# Patient Record
Sex: Male | Born: 1960 | Race: White | Hispanic: No | Smoking: Current every day smoker
Health system: Southern US, Community
[De-identification: ages and names within clinical notes are randomized; demographics above are authoritative.]

## PROBLEM LIST (undated history)

## (undated) DIAGNOSIS — F17200 Nicotine dependence, unspecified, uncomplicated: Secondary | ICD-10-CM

## (undated) DIAGNOSIS — F1010 Alcohol abuse, uncomplicated: Secondary | ICD-10-CM

---

## 2000-06-21 ENCOUNTER — Emergency Department (HOSPITAL_COMMUNITY): Admission: EM | Admit: 2000-06-21 | Discharge: 2000-06-21 | Payer: Self-pay | Admitting: Emergency Medicine

## 2000-06-28 ENCOUNTER — Emergency Department (HOSPITAL_COMMUNITY): Admission: EM | Admit: 2000-06-28 | Discharge: 2000-06-28 | Payer: Self-pay | Admitting: Emergency Medicine

## 2005-10-26 ENCOUNTER — Emergency Department (HOSPITAL_COMMUNITY): Admission: EM | Admit: 2005-10-26 | Discharge: 2005-10-26 | Payer: Self-pay | Admitting: Emergency Medicine

## 2007-11-05 ENCOUNTER — Emergency Department (HOSPITAL_COMMUNITY): Admission: EM | Admit: 2007-11-05 | Discharge: 2007-11-05 | Payer: Self-pay | Admitting: Emergency Medicine

## 2007-11-06 ENCOUNTER — Emergency Department (HOSPITAL_COMMUNITY): Admission: EM | Admit: 2007-11-06 | Discharge: 2007-11-06 | Payer: Self-pay | Admitting: Emergency Medicine

## 2009-09-08 ENCOUNTER — Emergency Department (HOSPITAL_COMMUNITY): Admission: EM | Admit: 2009-09-08 | Discharge: 2009-09-08 | Payer: Self-pay | Admitting: Emergency Medicine

## 2010-01-26 ENCOUNTER — Emergency Department (HOSPITAL_COMMUNITY): Admission: EM | Admit: 2010-01-26 | Discharge: 2010-01-26 | Payer: Self-pay | Admitting: Emergency Medicine

## 2010-06-14 LAB — COMPREHENSIVE METABOLIC PANEL
ALT: 20 U/L (ref 0–53)
Alkaline Phosphatase: 65 U/L (ref 39–117)
BUN: 7 mg/dL (ref 6–23)
CO2: 28 mEq/L (ref 19–32)
Calcium: 9.1 mg/dL (ref 8.4–10.5)
Chloride: 101 mEq/L (ref 96–112)
Creatinine, Ser: 0.96 mg/dL (ref 0.4–1.5)
GFR calc Af Amer: >60 mL/min (ref >60)
Glucose, Bld: 110 mg/dL — ABNORMAL HIGH (ref 70–99)
Potassium: 4.2 mEq/L (ref 3.5–5.1)
Sodium: 134 mEq/L — ABNORMAL LOW (ref 135–145)
Total Bilirubin: 0.5 mg/dL (ref 0.3–1.2)
Total Protein: 6.9 g/dL (ref 6.0–8.3)

## 2010-06-14 LAB — DIFFERENTIAL
Basophils Absolute: 0 10*3/uL (ref 0.0–0.1)
Basophils Relative: 0 % (ref 0–1)

## 2010-06-14 LAB — CBC
HCT: 45.6 % (ref 39.0–52.0)
Platelets: 166 10*3/uL (ref 150–400)

## 2010-06-14 LAB — URINALYSIS, ROUTINE W REFLEX MICROSCOPIC
Hgb urine dipstick: NEGATIVE
Specific Gravity, Urine: 1.013 (ref 1.005–1.030)
pH: 7 (ref 5.0–8.0)

## 2010-12-24 LAB — WOUND CULTURE

## 2011-01-02 ENCOUNTER — Emergency Department (HOSPITAL_COMMUNITY)
Admission: EM | Admit: 2011-01-02 | Discharge: 2011-01-02 | Disposition: A | Discharge status: Home / Self Care | Payer: Self-pay | Attending: Emergency Medicine | Admitting: Emergency Medicine

## 2011-01-02 DIAGNOSIS — T622X1A Toxic effect of other ingested (parts of) plant(s), accidental (unintentional), initial encounter: Secondary | ICD-10-CM | POA: Insufficient documentation

## 2011-01-02 DIAGNOSIS — L255 Unspecified contact dermatitis due to plants, except food: Secondary | ICD-10-CM | POA: Insufficient documentation

## 2011-01-17 ENCOUNTER — Emergency Department (HOSPITAL_COMMUNITY)
Admission: EM | Admit: 2011-01-17 | Discharge: 2011-01-17 | Disposition: A | Discharge status: Home / Self Care | Payer: Self-pay | Attending: Emergency Medicine | Admitting: Emergency Medicine

## 2011-01-17 DIAGNOSIS — F17200 Nicotine dependence, unspecified, uncomplicated: Secondary | ICD-10-CM | POA: Insufficient documentation

## 2011-01-17 DIAGNOSIS — F411 Generalized anxiety disorder: Secondary | ICD-10-CM | POA: Insufficient documentation

## 2011-01-17 DIAGNOSIS — R21 Rash and other nonspecific skin eruption: Secondary | ICD-10-CM | POA: Insufficient documentation

## 2011-07-26 ENCOUNTER — Ambulatory Visit: Payer: Self-pay | Admitting: Internal Medicine

## 2011-07-26 VITALS — BP 116/79 | HR 77 | Temp 98.2°F | Resp 16 | Ht 64.0 in

## 2011-07-26 DIAGNOSIS — L0390 Cellulitis, unspecified: Secondary | ICD-10-CM

## 2011-07-26 DIAGNOSIS — L0291 Cutaneous abscess, unspecified: Secondary | ICD-10-CM

## 2011-07-26 MED ORDER — SULFAMETHOXAZOLE-TRIMETHOPRIM 800-160 MG PO TABS: 1.0000 | ORAL_TABLET | Freq: Two times a day (BID) | ORAL | Status: AC

## 2011-07-26 NOTE — Progress Notes (Signed)
  Subjective:    Patient ID: Daniel Abbott, male    DOB: 09-Apr-1960, 51 y.o.   MRN: [id number]  HPI Multiple insect bites on left had and right arm and left arm Swelling pain puss coming from woudn no fever Onset 2 days ago Pt stuck a nail into each bite to drain it himself Tetanus unknown No meds  trying to get antibiotics from his friend on the "black market"  Review of Systems  Constitutional: Negative.   HENT: Negative.   Eyes: Negative.   Respiratory: Negative.   Cardiovascular: Negative.   Genitourinary: Negative.   Musculoskeletal: Negative.   Skin: Positive for wound.       One abcess right hand 3 on left hand and arm  Neurological: Negative.   Hematological: Negative.   Psychiatric/Behavioral: Negative.   All other systems reviewed and are negative.       Objective:   Physical Exam  Nursing note and vitals reviewed. Constitutional: He is oriented to person, place, and time. He appears well-developed and well-nourished.  HENT:  Head: Normocephalic and atraumatic.  Right Ear: External ear normal.  Left Ear: External ear normal.  Eyes: Conjunctivae and EOM are normal. Pupils are equal, round, and reactive to light.  Neck: Normal range of motion. Neck supple.  Cardiovascular: Normal rate, regular rhythm and normal heart sounds.   Abdominal: Soft.  Neurological: He is alert and oriented to person, place, and time.  Skin: Rash noted.       1 cm abscesses on r hand left arm left hand 4 in number   Psychiatric: He has a normal mood and affect. His behavior is normal. Judgment and thought content normal.          Assessment & Plan:  Multiple abscess on arms suspect skin popping. Will i and d and culture and rx antibiotics.

## 2011-07-29 LAB — WOUND CULTURE

## 2014-09-25 ENCOUNTER — Ambulatory Visit (INDEPENDENT_AMBULATORY_CARE_PROVIDER_SITE_OTHER): Payer: Self-pay | Admitting: Emergency Medicine

## 2014-09-25 VITALS — BP 130/84 | HR 72 | Resp 18 | Ht 67.25 in | Wt 145.4 lb

## 2014-09-25 DIAGNOSIS — K047 Periapical abscess without sinus: Secondary | ICD-10-CM

## 2014-09-25 MED ORDER — ACETAMINOPHEN-CODEINE #3 300-30 MG PO TABS: 1.0000 | ORAL_TABLET | ORAL | Status: DC | PRN

## 2014-09-25 MED ORDER — CEPHALEXIN 500 MG PO CAPS: 500.0000 mg | ORAL_CAPSULE | Freq: Four times a day (QID) | ORAL | Status: DC

## 2014-09-25 MED ORDER — CLINDAMYCIN HCL 150 MG PO CAPS: 150.0000 mg | ORAL_CAPSULE | Freq: Three times a day (TID) | ORAL | Status: DC

## 2014-09-25 NOTE — Patient Instructions (Signed)
Abscessed Tooth An abscessed tooth is an infection around your tooth. It may be caused by holes or damage to the tooth (cavity) or a dental disease. An abscessed tooth causes mild to very bad pain in and around the tooth. See your dentist right away if you have tooth or gum pain. HOME CARE  Take your medicine as told. Finish it even if you start to feel better.  Do not drive after taking pain medicine.  Rinse your mouth (gargle) often with salt water ( teaspoon salt in 8 ounces of warm water).  Do not apply heat to the outside of your face. GET HELP RIGHT AWAY IF:   You have a temperature by mouth above 102 F (38.9 C), not controlled by medicine.  You have chills and a very bad headache.  You have problems breathing or swallowing.  Your mouth will not open.  You develop puffiness (swelling) on the neck or around the eye.  Your pain is not helped by medicine.  Your pain is getting worse instead of better. MAKE SURE YOU:   Understand these instructions.  Will watch your condition.  Will get help right away if you are not doing well or get worse. Document Released: 08/31/2007 Document Revised: 06/06/2011 Document Reviewed: 06/22/2010 Exit[employer] Patient Information 2015 [employer]. This information is not intended to replace advice given to you by your health care provider. Make sure you discuss any questions you have with your health care provider.  

## 2014-09-25 NOTE — Progress Notes (Signed)
Subjective:  Patient ID: Daniel Abbott, male    DOB: 08-06-1960  Age: 54 y.o. MRN: [id number]  CC: Facial Swelling   HPI Daniel Abbott presents  with pain and swelling in Daniel Abbott left cheek. He has numerous dental extractions in the past. He is a badly decayed left mandibular molar 1 is rotted at the gumline and the other is extremely decayed. He has no fever or chills no cough coryza nausea or vomiting. Is no improvement with over-the-counter medication  History Daniel Abbott has no past medical history on file.   He has no past surgical history on file.   Daniel Abbott  family history includes Hypertension in Daniel Abbott mother.  He   reports that he has been smoking.  He does not have any smokeless tobacco history on file. Daniel Abbott alcohol and drug histories are not on file.  No outpatient prescriptions prior to visit.   No facility-administered medications prior to visit.    History   Social History  . Marital Status: Divorced    Spouse Name: N/A  . Number of Children: N/A  . Years of Education: N/A   Social History Main Topics  . Smoking status: Current Every Day Smoker  . Smokeless tobacco: Not on file  . Alcohol Use: Not on file  . Drug Use: Not on file  . Sexual Activity: Not on file   Other Topics Concern  . None   Social History Narrative     Review of Systems  Constitutional: Negative for fever, chills and appetite change.  HENT: Positive for dental problem and facial swelling. Negative for congestion, ear pain, postnasal drip, sinus pressure and sore throat.   Eyes: Negative for pain and redness.  Respiratory: Negative for cough, shortness of breath and wheezing.   Cardiovascular: Negative for leg swelling.  Gastrointestinal: Negative for nausea, vomiting, abdominal pain, diarrhea, constipation and blood in stool.  Endocrine: Negative for polyuria.  Genitourinary: Negative for dysuria, urgency, frequency and flank pain.  Musculoskeletal: Negative for gait problem.  Skin:  Negative for rash.  Neurological: Negative for weakness and headaches.  Psychiatric/Behavioral: Negative for confusion and decreased concentration. The patient is not nervous/anxious.     Objective:  BP 130/84 mmHg  Pulse 72  Resp 18  Ht 5' 7.25" (1.708 m)  Wt 145 lb 6.4 oz (65.953 kg)  BMI 22.61 kg/m2  SpO2 97%  Physical Exam  Constitutional: He is oriented to person, place, and time. He appears well-developed and well-nourished.  HENT:  Head: Normocephalic and atraumatic.  Mouth/Throat: Dental abscesses and dental caries present.  Eyes: Conjunctivae are normal. Pupils are equal, round, and reactive to light.  Pulmonary/Chest: Effort normal.  Musculoskeletal: He exhibits no edema.  Neurological: He is alert and oriented to person, place, and time.  Skin: Skin is dry.  Psychiatric: He has a normal mood and affect. Daniel Abbott behavior is normal. Thought content normal.      Assessment & Plan:   Daniel Abbott was seen today for facial swelling.  Diagnoses and all orders for this visit:  Periapical abscess without sinus  Other orders -     cephALEXin (KEFLEX) 500 MG capsule; Take 1 capsule (500 mg total) by mouth 4 (four) times daily. -     clindamycin (CLEOCIN) 150 MG capsule; Take 1 capsule (150 mg total) by mouth 3 (three) times daily. -     acetaminophen-codeine (TYLENOL #3) 300-30 MG per tablet; Take 1-2 tablets by mouth every 4 (four) hours as needed.  I am having Daniel Abbott  start on cephALEXin, clindamycin, and acetaminophen-codeine.  Meds ordered this encounter  Medications  . cephALEXin (KEFLEX) 500 MG capsule    Sig: Take 1 capsule (500 mg total) by mouth 4 (four) times daily.    Dispense:  40 capsule    Refill:  0  . clindamycin (CLEOCIN) 150 MG capsule    Sig: Take 1 capsule (150 mg total) by mouth 3 (three) times daily.    Dispense:  30 capsule    Refill:  0  . acetaminophen-codeine (TYLENOL #3) 300-30 MG per tablet    Sig: Take 1-2 tablets by mouth every 4 (four)  hours as needed.    Dispense:  15 tablet    Refill:  0   Daniel Abbott dental problem is certainly not acute he has badly decayed teeth and has not sought dental care treated with antibiotic senna small number of pain pills and instructed him to follow-up with dentist for extraction.  Appropriate red flag conditions were discussed with the patient as well as actions that should be taken.  Patient expressed Daniel Abbott understanding.  Follow-up: Return if symptoms worsen or fail to improve.  Carmelina DaneAnderson, Yasmene Salomone S, MD

## 2016-07-07 ENCOUNTER — Encounter (HOSPITAL_COMMUNITY): Payer: Self-pay

## 2016-07-07 ENCOUNTER — Emergency Department (HOSPITAL_COMMUNITY): Payer: Self-pay

## 2016-07-07 ENCOUNTER — Inpatient Hospital Stay (HOSPITAL_COMMUNITY)
Admission: EM | Admit: 2016-07-07 | Discharge: 2016-07-09 | DRG: 439 | Disposition: A | Discharge status: Home / Self Care | Payer: Self-pay | Attending: Internal Medicine | Admitting: Internal Medicine

## 2016-07-07 DIAGNOSIS — F1010 Alcohol abuse, uncomplicated: Secondary | ICD-10-CM | POA: Diagnosis present

## 2016-07-07 DIAGNOSIS — Z23 Encounter for immunization: Secondary | ICD-10-CM

## 2016-07-07 DIAGNOSIS — K2970 Gastritis, unspecified, without bleeding: Secondary | ICD-10-CM | POA: Diagnosis present

## 2016-07-07 DIAGNOSIS — K852 Alcohol induced acute pancreatitis without necrosis or infection: Principal | ICD-10-CM | POA: Diagnosis present

## 2016-07-07 DIAGNOSIS — R109 Unspecified abdominal pain: Secondary | ICD-10-CM

## 2016-07-07 DIAGNOSIS — F17200 Nicotine dependence, unspecified, uncomplicated: Secondary | ICD-10-CM | POA: Diagnosis present

## 2016-07-07 DIAGNOSIS — E876 Hypokalemia: Secondary | ICD-10-CM | POA: Diagnosis present

## 2016-07-07 DIAGNOSIS — K859 Acute pancreatitis without necrosis or infection, unspecified: Secondary | ICD-10-CM | POA: Diagnosis present

## 2016-07-07 DIAGNOSIS — K920 Hematemesis: Secondary | ICD-10-CM | POA: Diagnosis present

## 2016-07-07 HISTORY — DX: Alcohol abuse, uncomplicated: F10.10

## 2016-07-07 HISTORY — DX: Nicotine dependence, unspecified, uncomplicated: F17.200

## 2016-07-07 LAB — COMPREHENSIVE METABOLIC PANEL
ALT: 23 U/L (ref 17–63)
AST: 36 U/L (ref 15–41)
Albumin: 3.4 g/dL — ABNORMAL LOW (ref 3.5–5.0)
Alkaline Phosphatase: 77 U/L (ref 38–126)
Anion gap: 10 (ref 5–15)
CHLORIDE: 95 mmol/L — AB (ref 101–111)
CO2: 28 mmol/L (ref 22–32)
Calcium: 9.1 mg/dL (ref 8.9–10.3)
Creatinine, Ser: 0.98 mg/dL (ref 0.61–1.24)
GFR calc Af Amer: >60 mL/min (ref >60)
Glucose, Bld: 129 mg/dL — ABNORMAL HIGH (ref 65–99)
POTASSIUM: 3.5 mmol/L (ref 3.5–5.1)
Sodium: 133 mmol/L — ABNORMAL LOW (ref 135–145)
Total Bilirubin: 1 mg/dL (ref 0.3–1.2)
Total Protein: 7.1 g/dL (ref 6.5–8.1)

## 2016-07-07 LAB — CBC
HEMATOCRIT: 49.6 % (ref 39.0–52.0)
Hemoglobin: 17.2 g/dL — ABNORMAL HIGH (ref 13.0–17.0)
MCH: 35.8 pg — ABNORMAL HIGH (ref 26.0–34.0)
MCHC: 34.7 g/dL (ref 30.0–36.0)
MCV: 103.1 fL — AB (ref 78.0–100.0)
Platelets: 247 10*3/uL (ref 150–400)
RBC: 4.81 MIL/uL (ref 4.22–5.81)
RDW: 14.6 % (ref 11.5–15.5)
WBC: 12.4 10*3/uL — AB (ref 4.0–10.5)

## 2016-07-07 LAB — LIPASE, BLOOD: LIPASE: 208 U/L — AB (ref 11–51)

## 2016-07-07 LAB — TYPE AND SCREEN
ABO/RH(D): O POS
Antibody Screen: NEGATIVE

## 2016-07-07 LAB — ABO/RH: ABO/RH(D): O POS

## 2016-07-07 LAB — POC OCCULT BLOOD, ED: FECAL OCCULT BLD: NEGATIVE

## 2016-07-07 MED ORDER — FAMOTIDINE IN NACL 20-0.9 MG/50ML-% IV SOLN
20.0000 mg | Freq: Two times a day (BID) | INTRAVENOUS | Status: DC
  Administered 2016-07-07 – 2016-07-09 (×4): 20 mg via INTRAVENOUS
  Filled 2016-07-07 (×4): qty 50

## 2016-07-07 MED ORDER — PROMETHAZINE HCL 25 MG/ML IJ SOLN
12.5000 mg | Freq: Four times a day (QID) | INTRAMUSCULAR | Status: DC | PRN
  Administered 2016-07-08: 12.5 mg via INTRAVENOUS
  Filled 2016-07-07: qty 1

## 2016-07-07 MED ORDER — ONDANSETRON 4 MG PO TBDP
4.0000 mg | ORAL_TABLET | Freq: Once | ORAL | Status: AC | PRN
  Administered 2016-07-07: 4 mg via ORAL

## 2016-07-07 MED ORDER — ONDANSETRON 4 MG PO TBDP
ORAL_TABLET | ORAL | Status: AC
  Filled 2016-07-07: qty 1

## 2016-07-07 MED ORDER — HYDROMORPHONE HCL 1 MG/ML IJ SOLN
1.0000 mg | Freq: Once | INTRAMUSCULAR | Status: AC
  Administered 2016-07-07: 1 mg via INTRAVENOUS
  Filled 2016-07-07: qty 1

## 2016-07-07 MED ORDER — SODIUM CHLORIDE 0.9 % IV BOLUS (SEPSIS)
1000.0000 mL | Freq: Once | INTRAVENOUS | Status: AC
  Administered 2016-07-08: 1000 mL via INTRAVENOUS

## 2016-07-07 MED ORDER — SODIUM CHLORIDE 0.9 % IV BOLUS (SEPSIS)
1000.0000 mL | Freq: Once | INTRAVENOUS | Status: AC
  Administered 2016-07-07: 1000 mL via INTRAVENOUS

## 2016-07-07 NOTE — H&P (Signed)
History and Physical    Daniel Abbott ZOX:[id number] DOB: [birth year] DOA: 07/07/2016  PCP: Pcp Not In System   Patient coming from: [facility] physicians office.  I have personally briefly reviewed patient's old medical records in [facility]  Chief Complaint: Abdominal pain.  HPI: Daniel Abbott is a 56 y.o. male with medical history significant of tobacco use disorder, alcohol abuse (states he drinks 5-6 cans of beer every day, but his mother stated that he may be more) who is coming to the emergency department progressively worse referred by [facility] physicians for further evaluation of generalized abdominal pain, rectal bleeding, multiple episodes of vomiting and hematemesis for the past 2 days. He has been a heavy alcohol drinker for over 40 years. He denies precipitating, aggravating or relieving factors. He does not have any previous history of pancreatitis, alcohol detoxification admissions, diuretic use or history of hyperlipidemia. He denies chest pain, palpitations, dizziness, diaphoresis, pitting edema of the lower extremities, diarrhea, constipation, melena, dysuria or hematuria.  ED Course: The patient received a 2 L normal saline bolus, analgesics and antiemetics in the emergency department. His WBC 12.4, hemoglobin 17.2 g/dL and platelets 811. His stool guaiac was negative. His lipase level was 203. His CMP showed mildly decreased protein, sodium 133, chloride 95 and glucose of 129, otherwise is unremarkable.  Review of Systems: As per HPI otherwise 10 point review of systems negative.    Past Medical History:  Diagnosis Date  . Alcohol abuse   . Tobacco use disorder     History reviewed. No pertinent surgical history.   reports that he has been smoking.  He has never used smokeless tobacco. He reports that he drinks alcohol. His drug history is not on file.  No Known Allergies  Family History  Problem Relation Age of Onset  . Hypertension Mother     Prior to  Admission medications   Medication Sig Start Date End Date Taking? Authorizing Provider  Aspirin-Caffeine (BAYER BACK & BODY) 500-32.5 MG TABS Take 1-2 tablets by mouth daily as needed (pain).   Yes Historical Provider, MD    Physical Exam:  Constitutional: NAD, calm, comfortable Vitals:   07/07/16 2130 07/07/16 2200 07/07/16 2230 07/07/16 2300  BP: (!) 146/76 (!) 157/76 (!) 148/85 (!) 164/98  Pulse: 71 (!) 55 61 71  Resp:      Temp:      TempSrc:      SpO2: 97% 99% 98% 99%   Eyes: PERRL, lids and conjunctivae normal ENMT: Mucous membranes are dry. Posterior pharynx clear of any exudate or lesions. Neck: normal, supple, no masses, no thyromegaly Respiratory: clear to auscultation bilaterally, no wheezing, no crackles. Normal respiratory effort. No accessory muscle use.  Cardiovascular: Bradycardic at 56 BPM, no murmurs / rubs / gallops. No extremity edema. 2+ pedal pulses. No carotid bruits.  Abdomen: Soft, positive diffuse tenderness, no guarding/rebound/masses palpated. No hepatosplenomegaly. Bowel sounds positive.  Musculoskeletal: No clubbing / cyanosis.  Good ROM, no contractures. Normal muscle tone.  Skin: no rashes, lesions, ulcers. No induration Neurologic: CN 2-12 grossly intact. Sensation intact, DTR normal. Strength 5/5 in all 4.  Psychiatric: Normal judgment and insight. Alert and oriented x 3.   Labs on Admission: I have personally reviewed following labs and imaging studies  CBC:  Recent Labs Lab 07/07/16 1851  WBC 12.4*  HGB 17.2*  HCT 49.6  MCV 103.1*  PLT 247   Basic Metabolic Panel:  Recent Labs Lab 07/07/16 1851  NA 133*  K 3.5  CL 95*  CO2 28  GLUCOSE 129*  BUN <5*  CREATININE 0.98  CALCIUM 9.1   GFR: CrCl cannot be calculated (Unknown ideal weight.). Liver Function Tests:  Recent Labs Lab 07/07/16 1851  AST 36  ALT 23  ALKPHOS 77  BILITOT 1.0  PROT 7.1  ALBUMIN 3.4*    Recent Labs Lab 07/07/16 1851  LIPASE 208*   No  results for input(s): AMMONIA in the last 168 hours. Coagulation Profile: No results for input(s): INR, PROTIME in the last 168 hours. Cardiac Enzymes: No results for input(s): CKTOTAL, CKMB, CKMBINDEX, TROPONINI in the last 168 hours. BNP (last 3 results) No results for input(s): PROBNP in the last 8760 hours. HbA1C: No results for input(s): HGBA1C in the last 72 hours. CBG: No results for input(s): GLUCAP in the last 168 hours. Lipid Profile: No results for input(s): CHOL, HDL, LDLCALC, TRIG, CHOLHDL, LDLDIRECT in the last 72 hours. Thyroid Function Tests: No results for input(s): TSH, T4TOTAL, FREET4, T3FREE, THYROIDAB in the last 72 hours. Anemia Panel: No results for input(s): VITAMINB12, FOLATE, FERRITIN, TIBC, IRON, RETICCTPCT in the last 72 hours. Urine analysis:    Component Value Date/Time   COLORURINE YELLOW 09/08/2009 1541   APPEARANCEUR CLEAR 09/08/2009 1541   LABSPEC 1.013 09/08/2009 1541   PHURINE 7.0 09/08/2009 1541   GLUCOSEU NEGATIVE 09/08/2009 1541   HGBUR NEGATIVE 09/08/2009 1541   BILIRUBINUR NEGATIVE 09/08/2009 1541   KETONESUR NEGATIVE 09/08/2009 1541   PROTEINUR NEGATIVE 09/08/2009 1541   UROBILINOGEN 0.2 09/08/2009 1541   NITRITE NEGATIVE 09/08/2009 1541   LEUKOCYTESUR  09/08/2009 1541    NEGATIVE MICROSCOPIC NOT DONE ON URINES WITH NEGATIVE PROTEIN, BLOOD, LEUKOCYTES, NITRITE, OR GLUCOSE <1000 mg/dL.    Radiological Exams on Admission: Dg Chest Portable 1 View  Result Date: 07/07/2016 CLINICAL DATA:  56 y/o M; generalized abdominal pain, rectal bleeding, and hematemesis. EXAM: PORTABLE CHEST 1 VIEW COMPARISON:  None. FINDINGS: The heart size and mediastinal contours are within normal limits. Both lungs are clear. The visualized skeletal structures are unremarkable. IMPRESSION: No active disease. Electronically Signed   By: Mitzi Hansen M.D.   On: 07/07/2016 22:23     Assessment/Plan Principal Problem:   Acute pancreatitis Admit to  telemetry/inpatient. Keep nothing by mouth. Continue IV fluids. Analgesics as needed. Antiemetics as needed. Famotidine 20 mg IVP every 12 hours. Per patient, he may have had hematemesis. Please consider consulting GI in a.m.  Active Problems:   Tobacco use disorder Nicotine replacement therapy ordered. Smoking cessation info will be provided.    Alcohol abuse The patient has been a daily drinker for several decades. Briefly advised about long-term alcohol consumption related damage. He was started on CIWA protocol.    DVT prophylaxis: SCDs. Code Status: Full code. Family Communication: His mother was present in the emergency department. Disposition Plan: Admit for pancreatitis treatment and alcohol detox. Consults called:  Admission status: Inpatient/telemetry.   Bobette Mo MD [facility] Pager [phone].  If 7PM-7AM, please contact night-coverage [email or web address] Password [facility]  07/07/2016, 11:47 PM

## 2016-07-07 NOTE — ED Provider Notes (Signed)
[facility]-EMERGENCY DEPT Provider Note   CSN: [id number] Arrival date & time: 07/07/16  1825     History   Chief Complaint Chief Complaint  Patient presents with  . Abdominal Pain  . Rectal Bleeding  . Hematemesis    HPI Daniel Abbott is a 56 y.o. male.  The history is provided by the patient, a parent and medical records.  Abdominal Pain   This is a recurrent problem. The current episode started 2 days ago. The problem occurs constantly. The problem has not changed since onset.The pain is associated with alcohol use. The pain is located in the generalized abdominal region (worse in epigastrium). The pain is severe. Associated symptoms include nausea and vomiting. Pertinent negatives include fever, dysuria, hematuria and arthralgias. Nothing aggravates the symptoms. Nothing relieves the symptoms. Past workup comments: None. Past medical history comments: EtOH abuse.    Past Medical History:  Diagnosis Date  . Alcohol abuse   . Tobacco use disorder     Patient Active Problem List   Diagnosis Date Noted  . Acute pancreatitis 07/07/2016  . Tobacco use disorder 07/07/2016  . Alcohol abuse 07/07/2016    History reviewed. No pertinent surgical history.     Home Medications    Prior to Admission medications   Medication Sig Start Date End Date Taking? Authorizing Provider  Aspirin-Caffeine (BAYER BACK & BODY) 500-32.5 MG TABS Take 1-2 tablets by mouth daily as needed (pain).   Yes Historical Provider, MD    Family History Family History  Problem Relation Age of Onset  . Hypertension Mother     Social History Social History  Substance Use Topics  . Smoking status: Current Every Day Smoker  . Smokeless tobacco: Never Used  . Alcohol use Yes     Allergies   Patient has no known allergies.   Review of Systems Review of Systems  Constitutional: Negative for chills and fever.  HENT: Negative for ear pain and sore throat.   Eyes: Negative for pain and visual  disturbance.  Respiratory: Negative for cough and shortness of breath.   Cardiovascular: Negative for chest pain and palpitations.  Gastrointestinal: Positive for abdominal pain, blood in stool, nausea and vomiting.  Genitourinary: Negative for dysuria and hematuria.  Musculoskeletal: Negative for arthralgias and back pain.  Skin: Negative for color change and rash.  Neurological: Negative for seizures and syncope.  All other systems reviewed and are negative.    Physical Exam Updated Vital Signs BP (!) 164/98   Pulse 71   Temp 98.5 F (36.9 C) (Oral)   Resp 20   SpO2 99%   Physical Exam  Constitutional: He is oriented to person, place, and time. He appears well-developed and well-nourished.  Clearly in pain  HENT:  Head: Normocephalic and atraumatic.  Eyes: Conjunctivae are normal. No scleral icterus.  No conjunctival pallor  Neck: Neck supple.  Cardiovascular: Normal rate and regular rhythm.   No murmur heard. Pulmonary/Chest: Effort normal and breath sounds normal. No respiratory distress.  Abdominal: Soft. There is tenderness (diffusely, but worse in epigastrium).  No peritoneal signs  Genitourinary: Rectal exam shows guaiac negative stool.  Genitourinary Comments: Little stool in the vault, no frank blood on DRE  Musculoskeletal: He exhibits no edema.  Neurological: He is alert and oriented to person, place, and time.  Skin: Skin is warm and dry. Capillary refill takes less than 2 seconds. No pallor.  Psychiatric: He has a normal mood and affect. His behavior is normal.  Nursing note and  vitals reviewed.    ED Treatments / Results  Labs (all labs ordered are listed, but only abnormal results are displayed) Labs Reviewed  LIPASE, BLOOD - Abnormal; Notable for the following:       Result Value   Lipase 208 (*)    All other components within normal limits  COMPREHENSIVE METABOLIC PANEL - Abnormal; Notable for the following:    Sodium 133 (*)    Chloride 95 (*)     Glucose, Bld 129 (*)    BUN <5 (*)    Albumin 3.4 (*)    All other components within normal limits  CBC - Abnormal; Notable for the following:    WBC 12.4 (*)    Hemoglobin 17.2 (*)    MCV 103.1 (*)    MCH 35.8 (*)    All other components within normal limits  URINALYSIS, ROUTINE W REFLEX MICROSCOPIC  OCCULT BLOOD X 1 CARD TO LAB, STOOL  MAGNESIUM  PHOSPHORUS  POC OCCULT BLOOD, ED  TYPE AND SCREEN  ABO/RH    EKG  EKG Interpretation None       Radiology Dg Chest Portable 1 View  Result Date: 07/07/2016 CLINICAL DATA:  56 y/o M; generalized abdominal pain, rectal bleeding, and hematemesis. EXAM: PORTABLE CHEST 1 VIEW COMPARISON:  None. FINDINGS: The heart size and mediastinal contours are within normal limits. Both lungs are clear. The visualized skeletal structures are unremarkable. IMPRESSION: No active disease. Electronically Signed   By: Mitzi Hansen M.D.   On: 07/07/2016 22:23    Procedures Procedures (including critical care time)  Medications Ordered in ED Medications  ondansetron (ZOFRAN-ODT) 4 MG disintegrating tablet (not administered)  promethazine (PHENERGAN) injection 12.5 mg (not administered)  famotidine (PEPCID) IVPB 20 mg premix (20 mg Intravenous New Bag/Given 07/07/16 2316)  sodium chloride 0.9 % bolus 1,000 mL (not administered)  ondansetron (ZOFRAN-ODT) disintegrating tablet 4 mg (4 mg Oral Given 07/07/16 1851)  sodium chloride 0.9 % bolus 1,000 mL (0 mLs Intravenous Stopped 07/07/16 2311)  HYDROmorphone (DILAUDID) injection 1 mg (1 mg Intravenous Given 07/07/16 2316)     Initial Impression / Assessment and Plan / ED Course  I have reviewed the triage vital signs and the nursing notes.  Pertinent labs & imaging results that were available during my care of the patient were reviewed by me and considered in my medical decision making (see chart for details).    Pt with h/o EtOH abuse presents with abdominal pain, hematemesis, and BPR.  Says the symptoms started 2days ago and have not abated. He says he only drank 4 beers the day before his symptoms started and 2 the day of; his mother says he drinks at least 10 beers daily. He says he's had multiple bloody BMs yesterday and today & thinks he's probably had half as many episodes of bloody emesis. His mother took him to [facility] today where he was given  IM Zofran and told to come to the ED for suspected pancreatitis.  VS & exam as above. NS bolus given & IV pain medication given. Labs remarkable for lipase 208, Hgb 17.2. CXR WNL.  Will admit the Pt to the Hospitalist's service for further evaluation and treatment.  Final Clinical Impressions(s) / ED Diagnoses   Final diagnoses:  Alcohol abuse  Alcohol-induced acute pancreatitis, unspecified complication status    New Prescriptions New Prescriptions   No medications on file     Forest Becker, MD 07/07/16 2349    Gwyneth Sprout, MD 07/09/16 [phone]

## 2016-07-07 NOTE — ED Notes (Signed)
Attempted to call report

## 2016-07-07 NOTE — ED Triage Notes (Signed)
Pt sent here by [facility] with c/o generalized abdominal pain, rectal bleeding and hematemesis that started today. He reports feeling weak and has vomited multiple times today. Mom states the providers at [facility] suspect pancreatitis.

## 2016-07-07 NOTE — ED Notes (Signed)
Asked pt for urine specimen. Pt cannot go @ this time. Pt will try later.

## 2016-07-08 ENCOUNTER — Inpatient Hospital Stay (HOSPITAL_COMMUNITY): Payer: Self-pay

## 2016-07-08 LAB — LIPASE, BLOOD: Lipase: 162 U/L — ABNORMAL HIGH (ref 11–51)

## 2016-07-08 LAB — HIV ANTIBODY (ROUTINE TESTING W REFLEX): HIV Screen 4th Generation wRfx: NONREACTIVE

## 2016-07-08 LAB — URINALYSIS, ROUTINE W REFLEX MICROSCOPIC
Bilirubin Urine: NEGATIVE
Glucose, UA: NEGATIVE mg/dL
Hgb urine dipstick: NEGATIVE
KETONES UR: NEGATIVE mg/dL
LEUKOCYTES UA: NEGATIVE
NITRITE: NEGATIVE
PH: 8 (ref 5.0–8.0)
Protein, ur: NEGATIVE mg/dL
Specific Gravity, Urine: 1.01 (ref 1.005–1.030)

## 2016-07-08 LAB — CBC WITH DIFFERENTIAL/PLATELET
Basophils Absolute: 0 10*3/uL (ref 0.0–0.1)
Basophils Relative: 0 %
EOS PCT: 0 %
Eosinophils Absolute: 0 10*3/uL (ref 0.0–0.7)
HEMATOCRIT: 43.6 % (ref 39.0–52.0)
Hemoglobin: 14.8 g/dL (ref 13.0–17.0)
LYMPHS PCT: 21 %
Lymphs Abs: 2.1 10*3/uL (ref 0.7–4.0)
MCH: 35.2 pg — AB (ref 26.0–34.0)
MCHC: 33.9 g/dL (ref 30.0–36.0)
MCV: 103.8 fL — AB (ref 78.0–100.0)
MONO ABS: 0.8 10*3/uL (ref 0.1–1.0)
Monocytes Relative: 8 %
NEUTROS ABS: 7.3 10*3/uL (ref 1.7–7.7)
Neutrophils Relative %: 71 %
Platelets: 210 10*3/uL (ref 150–400)
RBC: 4.2 MIL/uL — ABNORMAL LOW (ref 4.22–5.81)
RDW: 14.9 % (ref 11.5–15.5)
WBC: 10.3 10*3/uL (ref 4.0–10.5)

## 2016-07-08 LAB — LIPID PANEL
CHOL/HDL RATIO: 4.6 ratio
Cholesterol: 125 mg/dL (ref 0–200)
HDL: 27 mg/dL — AB (ref >40)
LDL Cholesterol: 72 mg/dL (ref 0–99)
TRIGLYCERIDES: 132 mg/dL (ref <150)
VLDL: 26 mg/dL (ref 0–40)

## 2016-07-08 LAB — COMPREHENSIVE METABOLIC PANEL
ALBUMIN: 2.8 g/dL — AB (ref 3.5–5.0)
ALK PHOS: 64 U/L (ref 38–126)
ALT: 17 U/L (ref 17–63)
ANION GAP: 7 (ref 5–15)
AST: 26 U/L (ref 15–41)
BUN: 5 mg/dL — AB (ref 6–20)
CO2: 26 mmol/L (ref 22–32)
Calcium: 7.9 mg/dL — ABNORMAL LOW (ref 8.9–10.3)
Chloride: 103 mmol/L (ref 101–111)
Creatinine, Ser: 0.89 mg/dL (ref 0.61–1.24)
GFR calc Af Amer: >60 mL/min (ref >60)
GFR calc non Af Amer: >60 mL/min (ref >60)
GLUCOSE: 98 mg/dL (ref 65–99)
POTASSIUM: 3.4 mmol/L — AB (ref 3.5–5.1)
SODIUM: 136 mmol/L (ref 135–145)
Total Bilirubin: 0.8 mg/dL (ref 0.3–1.2)
Total Protein: 5.7 g/dL — ABNORMAL LOW (ref 6.5–8.1)

## 2016-07-08 LAB — MAGNESIUM: MAGNESIUM: 1.4 mg/dL — AB (ref 1.7–2.4)

## 2016-07-08 LAB — PHOSPHORUS: Phosphorus: 2.1 mg/dL — ABNORMAL LOW (ref 2.5–4.6)

## 2016-07-08 LAB — MRSA PCR SCREENING: MRSA by PCR: NEGATIVE

## 2016-07-08 MED ORDER — NICOTINE 21 MG/24HR TD PT24
21.0000 mg | MEDICATED_PATCH | Freq: Every day | TRANSDERMAL | Status: DC | PRN
  Administered 2016-07-08: 21 mg via TRANSDERMAL
  Filled 2016-07-08: qty 1

## 2016-07-08 MED ORDER — ONDANSETRON HCL 4 MG/2ML IJ SOLN: 4.0000 mg | Freq: Four times a day (QID) | INTRAMUSCULAR | Status: DC | PRN

## 2016-07-08 MED ORDER — THIAMINE HCL 100 MG/ML IJ SOLN: 100.0000 mg | Freq: Every day | INTRAMUSCULAR | Status: DC

## 2016-07-08 MED ORDER — MAGNESIUM SULFATE 2 GM/50ML IV SOLN
2.0000 g | Freq: Once | INTRAVENOUS | Status: AC
  Administered 2016-07-08: 2 g via INTRAVENOUS
  Filled 2016-07-08: qty 50

## 2016-07-08 MED ORDER — FOLIC ACID 1 MG PO TABS
1.0000 mg | ORAL_TABLET | Freq: Every day | ORAL | Status: DC
  Administered 2016-07-08 – 2016-07-09 (×2): 1 mg via ORAL
  Filled 2016-07-08 (×2): qty 1

## 2016-07-08 MED ORDER — LORAZEPAM 2 MG/ML IJ SOLN: 0.0000 mg | Freq: Four times a day (QID) | INTRAMUSCULAR | Status: DC

## 2016-07-08 MED ORDER — TRAMADOL HCL 50 MG PO TABS
50.0000 mg | ORAL_TABLET | Freq: Once | ORAL | Status: AC
  Administered 2016-07-08: 50 mg via ORAL
  Filled 2016-07-08: qty 1

## 2016-07-08 MED ORDER — LORAZEPAM 1 MG PO TABS: 1.0000 mg | ORAL_TABLET | Freq: Four times a day (QID) | ORAL | Status: DC | PRN

## 2016-07-08 MED ORDER — LORAZEPAM 2 MG/ML IJ SOLN: 1.0000 mg | Freq: Four times a day (QID) | INTRAMUSCULAR | Status: DC | PRN

## 2016-07-08 MED ORDER — ONDANSETRON HCL 4 MG PO TABS: 4.0000 mg | ORAL_TABLET | Freq: Four times a day (QID) | ORAL | Status: DC | PRN

## 2016-07-08 MED ORDER — POTASSIUM CHLORIDE IN NACL 20-0.9 MEQ/L-% IV SOLN
INTRAVENOUS | Status: DC
  Administered 2016-07-08 – 2016-07-09 (×4): via INTRAVENOUS
  Filled 2016-07-08 (×5): qty 1000

## 2016-07-08 MED ORDER — LORAZEPAM 2 MG/ML IJ SOLN: 0.0000 mg | Freq: Two times a day (BID) | INTRAMUSCULAR | Status: DC

## 2016-07-08 MED ORDER — VITAMIN B-1 100 MG PO TABS
100.0000 mg | ORAL_TABLET | Freq: Every day | ORAL | Status: DC
  Administered 2016-07-08 – 2016-07-09 (×2): 100 mg via ORAL
  Filled 2016-07-08 (×2): qty 1

## 2016-07-08 MED ORDER — PNEUMOCOCCAL VAC POLYVALENT 25 MCG/0.5ML IJ INJ
0.5000 mL | INJECTION | INTRAMUSCULAR | Status: AC
  Administered 2016-07-09: 0.5 mL via INTRAMUSCULAR
  Filled 2016-07-08: qty 0.5

## 2016-07-08 MED ORDER — HYDROMORPHONE HCL 1 MG/ML IJ SOLN
1.0000 mg | INTRAMUSCULAR | Status: DC | PRN
  Administered 2016-07-08 (×2): 1 mg via INTRAVENOUS
  Filled 2016-07-08 (×2): qty 1

## 2016-07-08 NOTE — Progress Notes (Signed)
Patient ID: Daniel Abbott, male   DOB: 1961-02-23, 55 y.o.   MRN: [id number]  PROGRESS NOTE    Devontaye Ground  EXB:[id number] DOB: [birth year] DOA: 07/07/2016 PCP: Pcp Not In System   Brief Narrative:  56 year old male with medical history of chronic tobacco use, alcohol abuse presented with complaints of abdominal pain, vomiting and probable hematemesis. He was admitted with the diagnoses of probable acute pancreatitis. He is currently NPO and on iv fluids and analgesics.  Assessment & Plan:   Principal Problem:   Acute pancreatitis Active Problems:   Tobacco use disorder   Alcohol abuse  1. Acute pancreatitis: Probably due to alcohol abuse Keep nothing by mouth. Start clear liquid diet from tonight and advance diet as tolerated Increase intravenous fluids to 150 mL an hour. Ultrasound of abdomen to rule out gallstones. Analgesics as needed. Antiemetics as needed. Famotidine 20 mg IVP every 12 hours. Patient has not had any hematemesis since admission. His hemoglobin is stable. Outpatient GI evaluation might be indicated. If hematemesis occurs again then we will consult GI. Repeat labs in a.m. If patient tolerates diet and condition improves, probable plan to discharge tomorrow  2. Tobacco use disorder Nicotine replacement therapy ordered. Counseled about tobacco cessation  3. Alcohol abuse Briefly advised about long-term alcohol consumption related damage. Continue with CIWA protocol. Social worker consult for arrangement of outpatient options for treatment of alcohol abuse. PT evaluation.  4. Leukocytosis: Probably reactive; resolved  5. Hypokalemia, Hypomagnesemia and hypophosphatemia: Will replace and repeat labs in AM  DVT prophylaxis: SCDs Code Status: Full Family Communication: Discussed with mother present at bedside  Disposition Plan: Home in 1-2 days.   Consultants: None  Procedures: None   Antimicrobials: None  Subjective: Patient seen and examined at  bedside. He is a little better. Still complaining of intermittent sharp abdominal pain. He is currently not nauseous or vomiting  Objective: Vitals:   07/08/16 0000 07/08/16 0025 07/08/16 0510 07/08/16 0842  BP: (!) 136/94 140/76 (!) 152/80 133/79  Pulse: 86 71 92 82  Resp: Temp:  98.3 F (36.8 C) 98.9 F (37.2 C) 98.4 F (36.9 C)  TempSrc:  Oral Oral Oral  SpO2: 98% 98% 95% 95%  Weight:  65.3 kg (144 lb)    Height:   (1.803 m)      Intake/Output Summary (Last 24 hours) at 07/08/16 1138 Last data filed at 07/08/16 0800  Gross per 24 hour  Intake          1539.58 ml  Output              300 ml  Net          1239.58 ml   Filed Weights   07/08/16 0025  Weight: 65.3 kg (144 lb)    Examination:  General exam: Appears calm and comfortable  Respiratory system: Clear to auscultation. Respiratory effort normal. Cardiovascular system: S1 & S2 heard, RRR.  Gastrointestinal system: Mild epigastric tenderness with no rebound tenderness. Abdomen is nondistended, soft. No organomegaly or masses felt. Normal bowel sounds heard. Central nervous system: Alert and oriented. No focal neurological deficits. Extremities: No cyanosis clubbing or edema     Data Reviewed: I have personally reviewed following labs and imaging studies  CBC:  Recent Labs Lab 07/07/16 1851 07/08/16 0223  WBC 12.4* 10.3  NEUTROABS  --  7.3  HGB 17.2* 14.8  HCT 49.6 43.6  MCV 103.1* 103.8*  PLT 247 210   Basic Metabolic  Panel:  Recent Labs Lab 07/07/16 1851 07/08/16 0223 07/08/16 0300  NA 133* 136  --   K 3.5 3.4*  --   CL 95* 103  --   CO2 28 26  --   GLUCOSE 129* 98  --   BUN <5* 5*  --   CREATININE 0.98 0.89  --   CALCIUM 9.1 7.9*  --   MG  --   --  1.4*  PHOS  --   --  2.1*   GFR: Estimated Creatinine Clearance: 86.6 mL/min (by C-G formula based on SCr of 0.89 mg/dL). Liver Function Tests:  Recent Labs Lab 07/07/16 1851 07/08/16 0223  AST 36 26  ALT 23 17    ALKPHOS 77 64  BILITOT 1.0 0.8  PROT 7.1 5.7*  ALBUMIN 3.4* 2.8*    Recent Labs Lab 07/07/16 1851 07/08/16 0223  LIPASE 208* 162*   No results for input(s): AMMONIA in the last 168 hours. Coagulation Profile: No results for input(s): INR, PROTIME in the last 168 hours. Cardiac Enzymes: No results for input(s): CKTOTAL, CKMB, CKMBINDEX, TROPONINI in the last 168 hours. BNP (last 3 results) No results for input(s): PROBNP in the last 8760 hours. HbA1C: No results for input(s): HGBA1C in the last 72 hours. CBG: No results for input(s): GLUCAP in the last 168 hours. Lipid Profile:  Recent Labs  07/08/16 0223  CHOL 125  HDL 27*  LDLCALC 72  TRIG 161  CHOLHDL 4.6   Thyroid Function Tests: No results for input(s): TSH, T4TOTAL, FREET4, T3FREE, THYROIDAB in the last 72 hours. Anemia Panel: No results for input(s): VITAMINB12, FOLATE, FERRITIN, TIBC, IRON, RETICCTPCT in the last 72 hours. Sepsis Labs: No results for input(s): PROCALCITON, LATICACIDVEN in the last 168 hours.  Recent Results (from the past 240 hour(s))  MRSA PCR Screening     Status: None   Collection Time: 07/08/16  1:06 AM  Result Value Ref Range Status   MRSA by PCR NEGATIVE NEGATIVE Final    Comment:        The GeneXpert MRSA Assay (FDA approved for NASAL specimens only), is one component of a comprehensive MRSA colonization surveillance program. It is not intended to diagnose MRSA infection nor to guide or monitor treatment for MRSA infections.          Radiology Studies: Dg Chest Portable 1 View  Result Date: 07/07/2016 CLINICAL DATA:  56 y/o M; generalized abdominal pain, rectal bleeding, and hematemesis. EXAM: PORTABLE CHEST 1 VIEW COMPARISON:  None. FINDINGS: The heart size and mediastinal contours are within normal limits. Both lungs are clear. The visualized skeletal structures are unremarkable. IMPRESSION: No active disease. Electronically Signed   By: Mitzi Hansen M.D.    On: 07/07/2016 22:23        Scheduled Meds: . famotidine (PEPCID) IV  20 mg Intravenous Q12H  . folic acid  1 mg Oral Daily  . LORazepam  0-4 mg Intravenous Q6H   Followed by  . [START ON 07/10/2016] LORazepam  0-4 mg Intravenous Q12H  . [START ON 07/09/2016] pneumococcal 23 valent vaccine  0.5 mL Intramuscular Tomorrow-1000  . thiamine  100 mg Oral Daily   Or  . thiamine  100 mg Intravenous Daily   Continuous Infusions: . 0.9 % NaCl with KCl 20 mEq / L 125 mL/hr at 07/08/16 1046       Avanelle Pixley Hanley Ben, MD [facility] Pager [phone]  If 7PM-7AM, please contact night-coverage [email or web address] Password [email or web address] 07/08/2016, 11:38 AM

## 2016-07-08 NOTE — Plan of Care (Signed)
Problem: Nutrition: Goal: Adequate nutrition will be maintained Outcome: Not Progressing Pt is NPO at this time

## 2016-07-09 LAB — CBC WITH DIFFERENTIAL/PLATELET
BASOS PCT: 0 %
Basophils Absolute: 0 10*3/uL (ref 0.0–0.1)
Eosinophils Absolute: 0.1 10*3/uL (ref 0.0–0.7)
Eosinophils Relative: 2 %
HEMATOCRIT: 40.2 % (ref 39.0–52.0)
HEMOGLOBIN: 13.4 g/dL (ref 13.0–17.0)
Lymphocytes Relative: 26 %
Lymphs Abs: 2.5 10*3/uL (ref 0.7–4.0)
MCH: 34.8 pg — ABNORMAL HIGH (ref 26.0–34.0)
MCHC: 33.3 g/dL (ref 30.0–36.0)
MCV: 104.4 fL — ABNORMAL HIGH (ref 78.0–100.0)
MONOS PCT: 7 %
Monocytes Absolute: 0.7 10*3/uL (ref 0.1–1.0)
NEUTROS ABS: 6.3 10*3/uL (ref 1.7–7.7)
NEUTROS PCT: 65 %
Platelets: 195 10*3/uL (ref 150–400)
RBC: 3.85 MIL/uL — AB (ref 4.22–5.81)
RDW: 14.8 % (ref 11.5–15.5)
WBC: 9.7 10*3/uL (ref 4.0–10.5)

## 2016-07-09 LAB — COMPREHENSIVE METABOLIC PANEL
ALK PHOS: 55 U/L (ref 38–126)
ALT: 15 U/L — ABNORMAL LOW (ref 17–63)
AST: 23 U/L (ref 15–41)
Albumin: 2.5 g/dL — ABNORMAL LOW (ref 3.5–5.0)
Anion gap: 7 (ref 5–15)
BUN: <5 mg/dL — ABNORMAL LOW (ref 6–20)
CALCIUM: 7.2 mg/dL — AB (ref 8.9–10.3)
CHLORIDE: 103 mmol/L (ref 101–111)
CO2: 23 mmol/L (ref 22–32)
Creatinine, Ser: 0.85 mg/dL (ref 0.61–1.24)
Glucose, Bld: 94 mg/dL (ref 65–99)
Potassium: 3.9 mmol/L (ref 3.5–5.1)
SODIUM: 133 mmol/L — AB (ref 135–145)
Total Bilirubin: 1 mg/dL (ref 0.3–1.2)
Total Protein: 5.8 g/dL — ABNORMAL LOW (ref 6.5–8.1)

## 2016-07-09 LAB — LIPASE, BLOOD: LIPASE: 39 U/L (ref 11–51)

## 2016-07-09 LAB — PHOSPHORUS: PHOSPHORUS: 1.7 mg/dL — AB (ref 2.5–4.6)

## 2016-07-09 LAB — MAGNESIUM: MAGNESIUM: 2.3 mg/dL (ref 1.7–2.4)

## 2016-07-09 MED ORDER — FOLIC ACID 1 MG PO TABS: 1.0000 mg | ORAL_TABLET | Freq: Every day | ORAL | 0 refills | Status: DC

## 2016-07-09 MED ORDER — THERA VITAL M PO TABS: 1.0000 | ORAL_TABLET | Freq: Every day | ORAL | 0 refills | Status: DC

## 2016-07-09 MED ORDER — FAMOTIDINE 20 MG PO TABS: 20.0000 mg | ORAL_TABLET | Freq: Two times a day (BID) | ORAL | 0 refills | Status: DC

## 2016-07-09 MED ORDER — K PHOS MONO-SOD PHOS DI & MONO 155-852-130 MG PO TABS
500.0000 mg | ORAL_TABLET | Freq: Once | ORAL | Status: AC
  Administered 2016-07-09: 500 mg via ORAL
  Filled 2016-07-09: qty 2

## 2016-07-09 MED ORDER — ONDANSETRON HCL 4 MG PO TABS: 4.0000 mg | ORAL_TABLET | Freq: Four times a day (QID) | ORAL | 0 refills | Status: DC | PRN

## 2016-07-09 MED ORDER — THIAMINE HCL 100 MG PO TABS: 100.0000 mg | ORAL_TABLET | Freq: Every day | ORAL | 0 refills | Status: DC

## 2016-07-09 MED ORDER — TRAMADOL HCL 50 MG PO TABS: 50.0000 mg | ORAL_TABLET | Freq: Four times a day (QID) | ORAL | 0 refills | Status: DC | PRN

## 2016-07-09 NOTE — Progress Notes (Signed)
D/c instructions discussed with pt and his mother. All questions answered and they verbalized understanding. Pt refused to be taken down by wheelchair and walked out independently accompanied by his mother and stepfather.

## 2016-07-09 NOTE — Discharge Summary (Signed)
Physician Discharge Summary  Daniel Abbott ZOX:[id number] DOB: 05-08-1960 DOA: 07/07/2016  PCP: Pcp Not In System  Admit date: 07/07/2016 Discharge date: 07/09/2016  Admitted From:  Home Disposition:  Home  Recommendations for Outpatient Follow-up:  1. Follow up with PCP in 1-2 weeks 2.  Follow up with gastroenterology if abdominal pain is persistent  Home Health: None Discharge Condition: Stable CODE STATUS: Full Diet recommendation: Heart Healthy  Brief/Interim Summary: 56 year old male with medical history of chronic tobacco use, alcohol abuse presented with complaints of abdominal pain, vomiting and probable hematemesis. He was admitted with the diagnoses of probable acute pancreatitis. He was treated with iv fluids, iv analgesics and bowel rest initially. Diet has been restarted and he has tolerated it. Counseling about alcohol cessation done.   Discharge Diagnoses:  Principal Problem:   Acute pancreatitis Active Problems:   Tobacco use disorder   Alcohol abuse  1. Acute pancreatitis: Probably due to alcohol abuse - S/P iv fluids, iv analgesics and NPO. Diet restarted and tolerating. -Ultrasound of abdomen negative for gallstones. - outpaitne follow up with PCP and GI if needed - Pepcid  bid for probable alcohol related gastritis  2. Tobacco use disorder Counseled about tobacco cessation  3. Alcohol abuse Briefly advised about long-term alcohol consumption related damage.  4. Leukocytosis: Probably reactive; resolved  5. Hypokalemia, Hypomagnesemia and hypophosphatemia: improving  Discharge Instructions  Discharge Instructions    Call MD for:  difficulty breathing, headache or visual disturbances    Complete by:  As directed    Call MD for:  persistant dizziness or light-headedness    Complete by:  As directed    Call MD for:  persistant nausea and vomiting    Complete by:  As directed    Call MD for:  severe uncontrolled pain    Complete by:  As  directed    Call MD for:  temperature >100.4    Complete by:  As directed    Diet - low sodium heart healthy    Complete by:  As directed    Increase activity slowly    Complete by:  As directed      Allergies as of 07/09/2016   No Known Allergies     Medication List    STOP taking these medications   BAYER BACK & BODY 500-32.5 MG Tabs Generic drug:  Aspirin-Caffeine     TAKE these medications   famotidine 20 MG tablet Commonly known as:  PEPCID Take 1 tablet (20 mg total) by mouth 2 (two) times daily.   folic acid 1 MG tablet Commonly known as:  FOLVITE Take 1 tablet (1 mg total) by mouth daily. Start taking on:  07/10/2016   multivitamin tablet Take 1 tablet by mouth daily.   ondansetron 4 MG tablet Commonly known as:  ZOFRAN Take 1 tablet (4 mg total) by mouth every 6 (six) hours as needed for nausea.   thiamine 100 MG tablet Take 1 tablet (100 mg total) by mouth daily. Start taking on:  07/10/2016   traMADol 50 MG tablet Commonly known as:  ULTRAM Take 1 tablet (50 mg total) by mouth every 6 (six) hours as needed.       No Known Allergies  Consultations:  None   Procedures/Studies: US Abdomen Complete  Result Date: 07/08/2016 CLINICAL DATA:  Abdominal pain and vomiting. Suspected pancreatitis. History of alcohol abuse. EXAM: ABDOMEN ULTRASOUND COMPLETE COMPARISON:  None. FINDINGS: Gallbladder: No gallstones or wall thickening visualized. No sonographic Murphy sign noted by  sonographer. Common bile duct: Diameter: Upper limits normal, 5.4 mm. Liver: Heterogeneous echotexture.  Hepatomegaly.  No focal lesion. IVC: Not visualized. Pancreas: Limited visualization, with the visualized portion unremarkable. Spleen: Size and appearance within normal limits. Right Kidney: Length: 10.4 cm. Echogenicity within normal limits. No mass or hydronephrosis visualized. Left Kidney: Length: 11.0 cm. Echogenicity within normal limits. No mass or hydronephrosis visualized.  Abdominal aorta: Not visualized. Other findings: None. IMPRESSION: Hepatomegaly with heterogeneous echotexture.  No focal lesion. No biliary ductal dilatation. No gallstones or gallbladder wall thickening. Electronically Signed   By: Elsie Stain M.D.   On: 07/08/2016 15:42   Dg Chest Portable 1 View  Result Date: 07/07/2016 CLINICAL DATA:  56 y/o M; generalized abdominal pain, rectal bleeding, and hematemesis. EXAM: PORTABLE CHEST 1 VIEW COMPARISON:  None. FINDINGS: The heart size and mediastinal contours are within normal limits. Both lungs are clear. The visualized skeletal structures are unremarkable. IMPRESSION: No active disease. Electronically Signed   By: Mitzi Hansen M.D.   On: 07/07/2016 22:23     Subjective: Patient seen and examined at bedside. He is a much better. Aabdominal pain has much improved. He is currently not nauseous or vomiting. He has tolerated diet.  Discharge Exam: Vitals:   07/08/16 2000 07/09/16 0326  BP: (!) 141/89 134/75  Pulse: 76 65  Resp: 18 18  Temp: 98.7 F (37.1 C) 97.9 F (36.6 C)   Vitals:   07/08/16 0842 07/08/16 1312 07/08/16 2000 07/09/16 0326  BP: 133/79 134/87 (!) 141/89 134/75  Pulse: 82 65 76 65  Resp: Temp: 98.4 F (36.9 C) 98.6 F (37 C) 98.7 F (37.1 C) 97.9 F (36.6 C)  TempSrc: Oral Oral Oral Oral  SpO2: 95% 99% 98% 99%  Weight:      Height:        General: Pt is alert, awake, not in acute distress Cardiovascular: RRR, S1/S2 +, no rubs, no gallops Respiratory: CTA bilaterally, no wheezing, no rhonchi Abdominal: Soft, NT, ND, bowel sounds + Extremities: no edema, no cyanosis    The results of significant diagnostics from this hospitalization (including imaging, microbiology, ancillary and laboratory) are listed below for reference.     Microbiology: Recent Results (from the past 240 hour(s))  MRSA PCR Screening     Status: None   Collection Time: 07/08/16  1:06 AM  Result Value Ref Range  Status   MRSA by PCR NEGATIVE NEGATIVE Final    Comment:        The GeneXpert MRSA Assay (FDA approved for NASAL specimens only), is one component of a comprehensive MRSA colonization surveillance program. It is not intended to diagnose MRSA infection nor to guide or monitor treatment for MRSA infections.      Labs: BNP (last 3 results) No results for input(s): BNP in the last 8760 hours. Basic Metabolic Panel:  Recent Labs Lab 07/07/16 1851 07/08/16 0223 07/08/16 0300 07/09/16 0320  NA 133* 136  --  133*  K 3.5 3.4*  --  3.9  CL 95* 103  --  103  CO2 28 26  --  23  GLUCOSE 129* 98  --  94  BUN <5* 5*  --  <5*  CREATININE 0.98 0.89  --  0.85  CALCIUM 9.1 7.9*  --  7.2*  MG  --   --  1.4* 2.3  PHOS  --   --  2.1* 1.7*   Liver Function Tests:  Recent Labs Lab 07/07/16 1851  07/08/16 0223 07/09/16 0320  AST 36 26 23  ALT 23 17 15*  ALKPHOS 77 64 55  BILITOT 1.0 0.8 1.0  PROT 7.1 5.7* 5.8*  ALBUMIN 3.4* 2.8* 2.5*    Recent Labs Lab 07/07/16 1851 07/08/16 0223 07/09/16 0320  LIPASE 208* 162* 39   No results for input(s): AMMONIA in the last 168 hours. CBC:  Recent Labs Lab 07/07/16 1851 07/08/16 0223 07/09/16 0320  WBC 12.4* 10.3 9.7  NEUTROABS  --  7.3 6.3  HGB 17.2* 14.8 13.4  HCT 49.6 43.6 40.2  MCV 103.1* 103.8* 104.4*  PLT 247 210 195   Cardiac Enzymes: No results for input(s): CKTOTAL, CKMB, CKMBINDEX, TROPONINI in the last 168 hours. BNP: Invalid input(s): POCBNP CBG: No results for input(s): GLUCAP in the last 168 hours. D-Dimer No results for input(s): DDIMER in the last 72 hours. Hgb A1c No results for input(s): HGBA1C in the last 72 hours. Lipid Profile  Recent Labs  07/08/16 0223  CHOL 125  HDL 27*  LDLCALC 72  TRIG 295  CHOLHDL 4.6   Thyroid function studies No results for input(s): TSH, T4TOTAL, T3FREE, THYROIDAB in the last 72 hours.  Invalid input(s): FREET3 Anemia work up No results for input(s):  VITAMINB12, FOLATE, FERRITIN, TIBC, IRON, RETICCTPCT in the last 72 hours. Urinalysis    Component Value Date/Time   COLORURINE YELLOW 07/07/2016 1845   APPEARANCEUR CLEAR 07/07/2016 1845   LABSPEC 1.010 07/07/2016 1845   PHURINE 8.0 07/07/2016 1845   GLUCOSEU NEGATIVE 07/07/2016 1845   HGBUR NEGATIVE 07/07/2016 1845   BILIRUBINUR NEGATIVE 07/07/2016 1845   KETONESUR NEGATIVE 07/07/2016 1845   PROTEINUR NEGATIVE 07/07/2016 1845   UROBILINOGEN 0.2 09/08/2009 1541   NITRITE NEGATIVE 07/07/2016 1845   LEUKOCYTESUR NEGATIVE 07/07/2016 1845   Sepsis Labs Invalid input(s): PROCALCITONIN,  WBC,  LACTICIDVEN Microbiology Recent Results (from the past 240 hour(s))  MRSA PCR Screening     Status: None   Collection Time: 07/08/16  1:06 AM  Result Value Ref Range Status   MRSA by PCR NEGATIVE NEGATIVE Final    Comment:        The GeneXpert MRSA Assay (FDA approved for NASAL specimens only), is one component of a comprehensive MRSA colonization surveillance program. It is not intended to diagnose MRSA infection nor to guide or monitor treatment for MRSA infections.      Time coordinating discharge: Over 30 minutes  SIGNED:   Glade Lloyd, MD  [facility] 07/09/2016, 9:12 AM Pager  [phone]  If 7PM-7AM, please contact night-coverage [email or web address] Password [email or web address]

## 2016-07-09 NOTE — Evaluation (Signed)
Physical Therapy Evaluation/ Discharge Patient Details Name: Daniel Abbott MRN: [id number] DOB: April 05, 1960 Today's Date: 07/09/2016   History of Present Illness  56 yo admitted with acute pancreatitis. PMHx: tobacco and alcohol abuse  Clinical Impression  PT pleasant moving well and without history of falls per pt. Pt reports he has been lying in the bed a few days and feels slightly weak but able to perform all basic transfers, gait and mobility. Pt at baseline functional level without further need for therapy intervention at this time, pt aware and agreeable. Will sign off.     Follow Up Recommendations No PT follow up    Equipment Recommendations  None recommended by PT    Recommendations for Other Services       Precautions / Restrictions Precautions Precautions: None      Mobility  Bed Mobility Overal bed mobility: Independent                Transfers Overall transfer level: Independent                  Ambulation/Gait Ambulation/Gait assistance: Independent Ambulation Distance (Feet): 800 Feet Assistive device: None Gait Pattern/deviations: WFL(Within Functional Limits)   Gait velocity interpretation: at or above normal speed for age/gender General Gait Details: pt able to complete gait with horizontal and vertical head turns and change of speed and direction. Pt with slight antalgic gait which pt reports legs is numb from how he was sitting  Stairs            Wheelchair Mobility    Modified Rankin (Stroke Patients Only)       Balance Overall balance assessment: No apparent balance deficits (not formally assessed)                                           Pertinent Vitals/Pain Pain Assessment: No/denies pain    Home Living Family/patient expects to be discharged to:: Private residence Living Arrangements: Alone Available Help at Discharge: Family;Available PRN/intermittently Type of Home: House Home Access:  Level entry     Home Layout: One level Home Equipment: None      Prior Function Level of Independence: Independent               Hand Dominance        Extremity/Trunk Assessment   Upper Extremity Assessment Upper Extremity Assessment: Overall WFL for tasks assessed    Lower Extremity Assessment Lower Extremity Assessment: Overall WFL for tasks assessed    Cervical / Trunk Assessment Cervical / Trunk Assessment: Normal  Communication   Communication: No difficulties  Cognition Arousal/Alertness: Awake/alert Behavior During Therapy: WFL for tasks assessed/performed Overall Cognitive Status: Within Functional Limits for tasks assessed                                        General Comments      Exercises     Assessment/Plan    PT Assessment Patent does not need any further PT services  PT Problem List         PT Treatment Interventions      PT Goals (Current goals can be found in the Care Plan section)  Acute Rehab PT Goals PT Goal Formulation: All assessment and education complete, DC therapy    Frequency  Barriers to discharge        Co-evaluation               End of Session   Activity Tolerance: Patient tolerated treatment well Patient left: in bed;with family/visitor present;with call bell/phone within reach Nurse Communication: Mobility status PT Visit Diagnosis: Other abnormalities of gait and mobility (R26.89)    Time: 9147-8295 PT Time Calculation (min) (ACUTE ONLY): 8 min   Charges:   PT Evaluation $PT Eval Low Complexity: 1 Procedure     PT G Codes:        Delaney Meigs, PT [phone]   [employer] Claudio B Shealee Yordy 07/09/2016, 10:37 AM

## 2016-07-09 NOTE — Discharge Instructions (Signed)
Acute Pancreatitis Acute pancreatitis is a condition in which the pancreas suddenly gets irritated and swollen (has inflammation). The pancreas is a large gland behind the stomach. It makes enzymes that help to digest food. The pancreas also makes hormones that help to control your blood sugar. Acute pancreatitis happens when the enzymes attack the pancreas and damage it. Most attacks last a couple of days and can cause serious problems. Follow these instructions at home: Eating and drinking   Follow instructions from your doctor about diet. You may need to:  Avoid alcohol.  Limit how much fat is in your diet.  Eat small meals often. Avoid eating big meals.  Drink enough fluid to keep your pee (urine) clear or pale yellow.  Do not drink alcohol if it caused your condition. General instructions   Take over-the-counter and prescription medicines only as told by your doctor.  Do not use any tobacco products. These include cigarettes, chewing tobacco, and e-cigarettes. If you need help quitting, ask your doctor.  Get plenty of rest.  If directed, check your blood sugar at home as told by your doctor.  Keep all follow-up visits as told by your doctor. This is important. Contact a doctor if:  You do not get better as quickly as expected.  You have new symptoms.  Your symptoms get worse.  You have lasting pain or weakness.  You continue to feel sick to your stomach (nauseous).  You get better and then you have another pain attack.  You have a fever. Get help right away if:  You cannot eat or keep fluids down.  Your pain becomes very bad.  Your skin or the white part of your eyes turns yellow (jaundice).  You throw up (vomit).  You feel dizzy or you pass out (faint).  Your blood sugar is high (over 300 mg/dL). This information is not intended to replace advice given to you by your health care provider. Make sure you discuss any questions you have with your health care  provider. Document Released: 08/31/2007 Document Revised: 08/20/2015 Document Reviewed: 12/16/2014 Elsevier Interactive Patient Education  2017 [employer]    Alcohol Use Disorder Alcohol use disorder is when your drinking disrupts your daily life. When you have this condition, you drink too much alcohol and you cannot control your drinking. Alcohol use disorder can cause serious problems with your physical health. It can affect your brain, heart, liver, pancreas, immune system, stomach, and intestines. Alcohol use disorder can increase your risk for certain cancers and cause problems with your mental health, such as depression, anxiety, psychosis, delirium, and dementia. People with this disorder risk hurting themselves and others. What are the causes? This condition is caused by drinking too much alcohol over time. It is not caused by drinking too much alcohol only one or two times. Some people with this condition drink alcohol to cope with or escape from negative life events. Others drink to relieve pain or symptoms of mental illness. What increases the risk? You are more likely to develop this condition if:  You have a family history of alcohol use disorder.  Your culture encourages drinking to the point of intoxication, or makes alcohol easy to get.  You had a mood or conduct disorder in childhood.  You have been a victim of abuse.  You are an adolescent and:  You have poor grades or difficulties in school.  Your caregivers do not talk to you about saying no to alcohol, or supervise your activities.  You are impulsive or you have trouble with self-control. What are the signs or symptoms? Symptoms of this condition include:  Drinkingmore than you want to.  Drinking for longer than you want to.  Trying several times to drink less or to control your drinking.  Spending a lot of time getting alcohol, drinking, or recovering from drinking.  Craving alcohol.  Having  problems at work, at school, or at home due to drinking.  Having problems in relationships due to drinking.  Drinking when it is dangerous to drink, such as before driving a car.  Continuing to drink even though you know you might have a physical or mental problem related to drinking.  Needing more and more alcohol to get the same effect you want from the alcohol (building up tolerance).  Having symptoms of withdrawal when you stop drinking. Symptoms of withdrawal include:  Fatigue.  Nightmares.  Trouble sleeping.  Depression.  Anxiety.  Fever.  Seizures.  Severe confusion.  Feeling or seeing things that are not there (hallucinations).  Tremors.  Rapid heart rate.  Rapid breathing.  High blood pressure.  Drinking to avoid symptoms of withdrawal. How is this diagnosed? This condition is diagnosed with an assessment. Your health care provider may start the assessment by asking three or four questions about your drinking. Your health care provider may perform a physical exam or do lab tests to see if you have physical problems resulting from alcohol use. She or he may refer you to a mental health professional for evaluation. How is this treated? Some people with alcohol use disorder are able to reduce their alcohol use to low-risk levels. Others need to completely quit drinking alcohol. When necessary, mental health professionals with specialized training in substance use treatment can help. Your health care provider can help you decide how severe your alcohol use disorder is and what type of treatment you need. The following forms of treatment are available:  Detoxification. Detoxification involves quitting drinking and using prescription medicines within the first week to help lessen withdrawal symptoms. This treatment is important for people who have had withdrawal symptoms before and for heavy drinkers who are likely to have withdrawal symptoms. Alcohol withdrawal can be  dangerous, and in severe cases, it can cause death. Detoxification may be provided in a home, community, or primary care setting, or in a hospital or substance use treatment facility.  Counseling. This treatment is also called talk therapy. It is provided by substance use treatment counselors. A counselor can address the reasons you use alcohol and suggest ways to keep you from drinking again or to prevent problem drinking. The goals of talk therapy are to:  Find healthy activities and ways for you to cope with stress.  Identify and avoid the things that trigger your alcohol use.  Help you learn how to handle cravings.  Medicines.Medicines can help treat alcohol use disorder by:  Decreasing alcohol cravings.  Decreasing the positive feeling you have when you drink alcohol.  Causing an uncomfortable physical reaction when you drink alcohol (aversion therapy).  Support groups. Support groups are led by people who have quit drinking. They provide emotional support, advice, and guidance. These forms of treatment are often combined. Some people with this condition benefit from a combination of treatments provided by specialized substance use treatment centers. Follow these instructions at home:  Take over-the-counter and prescription medicines only as told by your health care provider.  Check with your health care provider before starting any new medicines.  Ask friends and family members not to offer you alcohol.  Avoid situations where alcohol is served, including gatherings where others are drinking alcohol.  Create a plan for what to do when you are tempted to use alcohol.  Find hobbies or activities that you enjoy that do not include alcohol.  Keep all follow-up visits as told by your health care provider. This is important. How is this prevented?  If you drink, limit alcohol intake to no more than 1 drink a day for nonpregnant women and 2 drinks a day for men. One drink equals  12 oz of beer, 5 oz of wine, or 1 oz of hard liquor.  If you have a mental health condition, get treatment and support.  Do not give alcohol to adolescents.  If you are an adolescent:  Do not drink alcohol.  Do not be afraid to say no if someone offers you alcohol. Speak up about why you do not want to drink. You can be a positive role model for your friends and set a good example for those around you by not drinking alcohol.  If your friends drink, spend time with others who do not drink alcohol. Make new friends who do not use alcohol.  Find healthy ways to manage stress and emotions, such as meditation or deep breathing, exercise, spending time in nature, listening to music, or talking with a trusted friend or family member. Contact a health care provider if:  You are not able to take your medicines as told.  Your symptoms get worse.  You return to drinking alcohol (relapse) and your symptoms get worse. Get help right away if:  You have thoughts about hurting yourself or others. If you ever feel like you may hurt yourself or others, or have thoughts about taking your own life, get help right away. You can go to your nearest emergency department or call:  Your local emergency services (911 in the [place]  A suicide crisis helpline, such as the National Suicide Prevention Lifeline at 406 108 9610. This is open 24 hours a day. Summary  Alcohol use disorder is when your drinking disrupts your daily life. When you have this condition, you drink too much alcohol and you cannot control your drinking.  Treatment may include detoxification, counseling, medicine, and support groups.  Ask friends and family members not to offer you alcohol. Avoid situations where alcohol is served.  Get help right away if you have thoughts about hurting yourself or others. This information is not intended to replace advice given to you by your health care provider. Make sure you discuss any questions  you have with your health care provider. Document Released: 04/21/2004 Document Revised: 12/10/2015 Document Reviewed: 12/10/2015 Elsevier Interactive Patient Education  2017 [employer].

## 2017-06-08 ENCOUNTER — Other Ambulatory Visit: Payer: Self-pay

## 2017-06-08 ENCOUNTER — Emergency Department (HOSPITAL_COMMUNITY): Admission: EM | Admit: 2017-06-08 | Discharge: 2017-06-08 | Discharge status: Against Medical Advice | Payer: Self-pay

## 2017-06-08 ENCOUNTER — Encounter (HOSPITAL_COMMUNITY): Payer: Self-pay | Admitting: Emergency Medicine

## 2017-06-08 ENCOUNTER — Emergency Department (HOSPITAL_COMMUNITY)
Admission: EM | Admit: 2017-06-08 | Discharge: 2017-06-09 | Disposition: A | Discharge status: Home / Self Care | Payer: No Typology Code available for payment source | Attending: Emergency Medicine | Admitting: Emergency Medicine

## 2017-06-08 DIAGNOSIS — Z79899 Other long term (current) drug therapy: Secondary | ICD-10-CM | POA: Diagnosis not present

## 2017-06-08 DIAGNOSIS — Y929 Unspecified place or not applicable: Secondary | ICD-10-CM | POA: Diagnosis not present

## 2017-06-08 DIAGNOSIS — S01312A Laceration without foreign body of left ear, initial encounter: Secondary | ICD-10-CM

## 2017-06-08 DIAGNOSIS — Z23 Encounter for immunization: Secondary | ICD-10-CM | POA: Diagnosis not present

## 2017-06-08 DIAGNOSIS — F17200 Nicotine dependence, unspecified, uncomplicated: Secondary | ICD-10-CM | POA: Diagnosis not present

## 2017-06-08 DIAGNOSIS — S0181XA Laceration without foreign body of other part of head, initial encounter: Secondary | ICD-10-CM

## 2017-06-08 DIAGNOSIS — Y9389 Activity, other specified: Secondary | ICD-10-CM | POA: Diagnosis not present

## 2017-06-08 DIAGNOSIS — S0990XA Unspecified injury of head, initial encounter: Secondary | ICD-10-CM | POA: Diagnosis present

## 2017-06-08 DIAGNOSIS — Y999 Unspecified external cause status: Secondary | ICD-10-CM | POA: Insufficient documentation

## 2017-06-08 DIAGNOSIS — M25511 Pain in right shoulder: Secondary | ICD-10-CM | POA: Insufficient documentation

## 2017-06-08 DIAGNOSIS — F1010 Alcohol abuse, uncomplicated: Secondary | ICD-10-CM
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
## 2017-06-08 NOTE — ED Notes (Signed)
Pt reported to Tharon AquasJ Lacy, NT that he was leaving and gave her his stickers

## 2017-06-09 ENCOUNTER — Emergency Department (HOSPITAL_COMMUNITY): Payer: No Typology Code available for payment source

## 2017-06-09 LAB — BASIC METABOLIC PANEL
Anion gap: 11 (ref 5–15)
BUN: 5 mg/dL — ABNORMAL LOW (ref 6–20)
CALCIUM: 8.7 mg/dL — AB (ref 8.9–10.3)
CHLORIDE: 96 mmol/L — AB (ref 101–111)
CO2: 26 mmol/L (ref 22–32)
CREATININE: 0.93 mg/dL (ref 0.61–1.24)
GFR calc non Af Amer: >60 mL/min (ref >60)
GLUCOSE: 117 mg/dL — AB (ref 65–99)
Potassium: 3 mmol/L — ABNORMAL LOW (ref 3.5–5.1)
Sodium: 133 mmol/L — ABNORMAL LOW (ref 135–145)

## 2017-06-09 LAB — CBC WITH DIFFERENTIAL/PLATELET
Basophils Absolute: 0.1 10*3/uL (ref 0.0–0.1)
Basophils Relative: 1 %
Eosinophils Absolute: 0.1 10*3/uL (ref 0.0–0.7)
Eosinophils Relative: 1 %
HEMATOCRIT: 45 % (ref 39.0–52.0)
HEMOGLOBIN: 15.9 g/dL (ref 13.0–17.0)
LYMPHS ABS: 4.4 10*3/uL — AB (ref 0.7–4.0)
Lymphocytes Relative: 46 %
MCH: 37.1 pg — ABNORMAL HIGH (ref 26.0–34.0)
MCHC: 35.3 g/dL (ref 30.0–36.0)
MCV: 104.9 fL — ABNORMAL HIGH (ref 78.0–100.0)
MONO ABS: 1 10*3/uL (ref 0.1–1.0)
MONOS PCT: 11 %
NEUTROS ABS: 4 10*3/uL (ref 1.7–7.7)
NEUTROS PCT: 41 %
Platelets: 236 10*3/uL (ref 150–400)
RBC: 4.29 MIL/uL (ref 4.22–5.81)
RDW: 14.9 % (ref 11.5–15.5)
WBC: 9.6 10*3/uL (ref 4.0–10.5)

## 2017-06-09 LAB — ETHANOL: Alcohol, Ethyl (B): 143 mg/dL — ABNORMAL HIGH (ref <10)

## 2017-06-09 MED ORDER — POTASSIUM CHLORIDE CRYS ER 20 MEQ PO TBCR: 40.0000 meq | EXTENDED_RELEASE_TABLET | Freq: Once | ORAL | Status: DC

## 2017-06-09 MED ORDER — MORPHINE SULFATE (PF) 4 MG/ML IV SOLN
4.0000 mg | Freq: Once | INTRAVENOUS | Status: AC
  Administered 2017-06-09: 4 mg via INTRAVENOUS
  Filled 2017-06-09: qty 1

## 2017-06-09 MED ORDER — LIDOCAINE-EPINEPHRINE (PF) 2 %-1:200000 IJ SOLN
20.0000 mL | Freq: Once | INTRAMUSCULAR | Status: AC
  Administered 2017-06-09: 20 mL via INTRADERMAL
  Filled 2017-06-09: qty 20

## 2017-06-09 MED ORDER — ONDANSETRON HCL 4 MG/2ML IJ SOLN
4.0000 mg | Freq: Once | INTRAMUSCULAR | Status: AC
  Administered 2017-06-09: 4 mg via INTRAVENOUS
  Filled 2017-06-09: qty 2

## 2017-06-09 MED ORDER — TRAMADOL HCL 50 MG PO TABS: 50.0000 mg | ORAL_TABLET | Freq: Four times a day (QID) | ORAL | 0 refills | Status: AC | PRN

## 2017-06-09 MED ORDER — TETANUS-DIPHTH-ACELL PERTUSSIS 5-2.5-18.5 LF-MCG/0.5 IM SUSP
0.5000 mL | Freq: Once | INTRAMUSCULAR | Status: AC
Start: 2017-06-09 — End: 2017-06-09
  Administered 2017-06-09: 0.5 mL via INTRAMUSCULAR
  Filled 2017-06-09: qty 0.5

## 2017-06-09 MED ORDER — SODIUM CHLORIDE 0.9 % IV BOLUS (SEPSIS)
1000.0000 mL | Freq: Once | INTRAVENOUS | Status: AC
  Administered 2017-06-09: 1000 mL via INTRAVENOUS
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
## 2017-06-09 NOTE — Discharge Instructions (Signed)
Return in 5 days for sutures removal.  Take tramadol as needed for pain.  Monitor wound for signs of infection.

## 2018-09-28 ENCOUNTER — Emergency Department (HOSPITAL_COMMUNITY)
Admission: EM | Admit: 2018-09-28 | Discharge: 2018-09-28 | Disposition: A | Discharge status: Home / Self Care | Payer: Self-pay | Attending: Emergency Medicine | Admitting: Emergency Medicine

## 2018-09-28 ENCOUNTER — Other Ambulatory Visit: Payer: Self-pay

## 2018-09-28 ENCOUNTER — Emergency Department (HOSPITAL_COMMUNITY): Payer: Self-pay

## 2018-09-28 ENCOUNTER — Encounter (HOSPITAL_COMMUNITY): Payer: Self-pay

## 2018-09-28 DIAGNOSIS — F17200 Nicotine dependence, unspecified, uncomplicated: Secondary | ICD-10-CM | POA: Insufficient documentation

## 2018-09-28 DIAGNOSIS — K852 Alcohol induced acute pancreatitis without necrosis or infection: Secondary | ICD-10-CM | POA: Insufficient documentation

## 2018-09-28 LAB — CBC WITH DIFFERENTIAL/PLATELET
Abs Immature Granulocytes: 0.07 10*3/uL (ref 0.00–0.07)
Basophils Absolute: 0.1 10*3/uL (ref 0.0–0.1)
Basophils Relative: 0 %
Eosinophils Absolute: 0 10*3/uL (ref 0.0–0.5)
Eosinophils Relative: 0 %
HCT: 48.4 % (ref 39.0–52.0)
Hemoglobin: 16.4 g/dL (ref 13.0–17.0)
Immature Granulocytes: 1 %
Lymphocytes Relative: 7 %
Lymphs Abs: 1.1 10*3/uL (ref 0.7–4.0)
MCH: 35 pg — ABNORMAL HIGH (ref 26.0–34.0)
MCHC: 33.9 g/dL (ref 30.0–36.0)
MCV: 103.4 fL — ABNORMAL HIGH (ref 80.0–100.0)
Monocytes Absolute: 0.9 10*3/uL (ref 0.1–1.0)
Monocytes Relative: 6 %
Neutro Abs: 12.9 10*3/uL — ABNORMAL HIGH (ref 1.7–7.7)
Neutrophils Relative %: 86 %
Platelets: 192 10*3/uL (ref 150–400)
RBC: 4.68 MIL/uL (ref 4.22–5.81)
RDW: 14.7 % (ref 11.5–15.5)
WBC: 15 10*3/uL — ABNORMAL HIGH (ref 4.0–10.5)
nRBC: 0 % (ref 0.0–0.2)

## 2018-09-28 LAB — COMPREHENSIVE METABOLIC PANEL
ALT: 19 U/L (ref 0–44)
AST: 38 U/L (ref 15–41)
Albumin: 3.1 g/dL — ABNORMAL LOW (ref 3.5–5.0)
Alkaline Phosphatase: 68 U/L (ref 38–126)
Anion gap: 13 (ref 5–15)
BUN: 7 mg/dL (ref 6–20)
CO2: 29 mmol/L (ref 22–32)
Calcium: 9.4 mg/dL (ref 8.9–10.3)
Chloride: 98 mmol/L (ref 98–111)
Creatinine, Ser: 1.1 mg/dL (ref 0.61–1.24)
GFR calc Af Amer: >60 mL/min (ref >60)
GFR calc non Af Amer: >60 mL/min (ref >60)
Glucose, Bld: 176 mg/dL — ABNORMAL HIGH (ref 70–99)
Potassium: 3 mmol/L — ABNORMAL LOW (ref 3.5–5.1)
Sodium: 140 mmol/L (ref 135–145)
Total Bilirubin: 1.2 mg/dL (ref 0.3–1.2)
Total Protein: 6.4 g/dL — ABNORMAL LOW (ref 6.5–8.1)

## 2018-09-28 LAB — LACTIC ACID, PLASMA
Lactic Acid, Venous: 3.9 mmol/L (ref 0.5–1.9)
Lactic Acid, Venous: 2.2 mmol/L (ref 0.5–1.9)

## 2018-09-28 LAB — TROPONIN I (HIGH SENSITIVITY)
Troponin I (High Sensitivity): 5 ng/L (ref <18)
Troponin I (High Sensitivity): 7 ng/L (ref <18)

## 2018-09-28 LAB — LIPASE, BLOOD: Lipase: 255 U/L — ABNORMAL HIGH (ref 11–51)

## 2018-09-28 MED ORDER — MORPHINE SULFATE (PF) 4 MG/ML IV SOLN
8.0000 mg | Freq: Once | INTRAVENOUS | Status: AC
  Administered 2018-09-28: 8 mg via INTRAVENOUS
  Filled 2018-09-28: qty 2

## 2018-09-28 MED ORDER — ONDANSETRON HCL 4 MG/2ML IJ SOLN
4.0000 mg | Freq: Once | INTRAMUSCULAR | Status: AC
  Administered 2018-09-28: 10:00:00 4 mg via INTRAVENOUS
  Filled 2018-09-28: qty 2

## 2018-09-28 MED ORDER — IOHEXOL 300 MG/ML  SOLN
100.0000 mL | Freq: Once | INTRAMUSCULAR | Status: AC | PRN
  Administered 2018-09-28: 100 mL via INTRAVENOUS

## 2018-09-28 MED ORDER — MORPHINE SULFATE 15 MG PO TABS: 15.0000 mg | ORAL_TABLET | ORAL | 0 refills | Status: AC | PRN

## 2018-09-28 MED ORDER — ONDANSETRON 4 MG PO TBDP: 4.0000 mg | ORAL_TABLET | Freq: Three times a day (TID) | ORAL | 0 refills | Status: AC | PRN

## 2018-09-28 MED ORDER — SODIUM CHLORIDE 0.9 % IV BOLUS
1000.0000 mL | Freq: Once | INTRAVENOUS | Status: AC
Start: 2018-09-28 — End: 2018-09-28
  Administered 2018-09-28: 1000 mL via INTRAVENOUS

## 2018-09-28 NOTE — Discharge Instructions (Addendum)
Start with clear liquid diets for at least the next 48 hours.  Then he can progress to things that are gentle in her stomach like bananas rice applesauce and toast.  After that you can transition to a normal diet.  Please follow-up with family doctor or gastroenterologist.  Return for worsening pain fever or inability to tolerate by mouth.

## 2018-09-28 NOTE — ED Notes (Signed)
Patient transported to CT 

## 2018-09-28 NOTE — ED Notes (Signed)
Pt mom Irven Shelling [phone]

## 2018-09-28 NOTE — ED Notes (Signed)
Dr. Tyrone Nine notified of elevated lactic

## 2018-09-28 NOTE — ED Provider Notes (Signed)
[facility]   CSN: [id number] Arrival date & time: 09/28/18  0930    History   Chief Complaint Chief Complaint  Patient presents with  . Chest Pain  . Abdominal Pain    HPI Jennings Booksnthony Shaneyfelt is a 58 y.o. male.     58 yo M with a chief complaints of epigastric abdominal pain.  Started 5 in the morning woke him up from sleep.  Radiates up into his lower chest.  Feels like this may be similar to his prior diagnosis of pancreatitis.  No radiation to the back.  Has had some nausea and vomiting.  Subjective fevers and chills.  No shortness of breath.  Described as sharp and shooting.  10 out of 10.  The history is provided by the patient.  Chest Pain Pain location:  Epigastric Pain quality: sharp and shooting   Pain radiates to:  Precordial region Pain severity:  Severe Onset quality:  Sudden Duration:  4 hours Timing:  Constant Progression:  Unchanged Chronicity:  New Relieved by:  Nothing Worsened by:  Nothing Ineffective treatments:  None tried Associated symptoms: abdominal pain   Associated symptoms: no fever, no headache, no palpitations, no shortness of breath and no vomiting   Abdominal Pain Associated symptoms: chest pain   Associated symptoms: no chills, no diarrhea, no fever, no shortness of breath and no vomiting     Past Medical History:  Diagnosis Date  . Alcohol abuse   . Tobacco use disorder     Patient Active Problem List   Diagnosis Date Noted  . Acute pancreatitis 07/07/2016  . Tobacco use disorder 07/07/2016  . Alcohol abuse 07/07/2016    History reviewed. No pertinent surgical history.      Home Medications    Prior to Admission medications   Medication Sig Start Date End Date Taking? Authorizing Provider  aspirin 325 MG tablet Take 325 mg by mouth every 6 (six) hours as needed for mild pain.   Yes [provider]  morphine (MSIR) 15 MG tablet Take 1 tablet (15 mg total) by  mouth every 4 (four) hours as needed for severe pain. 09/28/18   Melene PlanFloyd, Joe Tanney, DO  ondansetron (ZOFRAN ODT) 4 MG disintegrating tablet Take 1 tablet (4 mg total) by mouth every 8 (eight) hours as needed for nausea or vomiting. 09/28/18   Melene PlanFloyd, Eliga Arvie, DO  traMADol (ULTRAM) 50 MG tablet Take 1 tablet (50 mg total) by mouth every 6 (six) hours as needed for moderate pain or severe pain. Patient not taking: Reported on 09/28/2018 06/09/17   Fayrene Helperran, [place], PA-C    Family History Family History  Problem Relation Age of Onset  . Hypertension Mother     Social History Social History   Tobacco Use  . Smoking status: Current Every Day Smoker  . Smokeless tobacco: Never Used  Substance Use Topics  . Alcohol use: Yes    Comment: heavy; 6 pack/day  . Drug use: No     Allergies   Patient has no known allergies.   Review of Systems Review of Systems  Constitutional: Negative for chills and fever.  HENT: Negative for congestion and facial swelling.   Eyes: Negative for discharge and visual disturbance.  Respiratory: Negative for shortness of breath.   Cardiovascular: Positive for chest pain. Negative for palpitations.  Gastrointestinal: Positive for abdominal pain. Negative for diarrhea and vomiting.  Musculoskeletal: Negative for arthralgias and myalgias.  Skin: Negative for color change and rash.  Neurological: Negative for tremors, syncope and headaches.  Psychiatric/Behavioral: Negative for confusion and dysphoric mood.     Physical Exam Updated Vital Signs BP (!) 156/96   Pulse (!) 52   Temp 97.7 F (36.5 C) (Oral)   Resp 19   Ht 5\' 10"  (1.778 m)   Wt 65.8 kg   SpO2 96%   BMI 20.81 kg/m   Physical Exam Vitals signs and nursing note reviewed.  Constitutional:      Appearance: He is well-developed.  HENT:     Head: Normocephalic and atraumatic.  Eyes:     Pupils: Pupils are equal, round, and reactive to light.  Neck:     Musculoskeletal: Normal range of motion and neck  supple.     Vascular: No JVD.  Cardiovascular:     Rate and Rhythm: Normal rate and regular rhythm.     Heart sounds: No murmur. No friction rub. No gallop.   Pulmonary:     Effort: No respiratory distress.     Breath sounds: No wheezing.  Abdominal:     General: There is no distension.     Tenderness: There is abdominal tenderness (epigastric and RUQ). There is guarding. There is no rebound.  Musculoskeletal: Normal range of motion.  Skin:    Coloration: Skin is not pale.     Findings: No rash.  Neurological:     Mental Status: He is alert and oriented to person, place, and time.  Psychiatric:        Behavior: Behavior normal.      ED Treatments / Results  Labs (all labs ordered are listed, but only abnormal results are displayed) Labs Reviewed  COMPREHENSIVE METABOLIC PANEL - Abnormal; Notable for the following components:      Result Value   Potassium 3.0 (*)    Glucose, Bld 176 (*)    Total Protein 6.4 (*)    Albumin 3.1 (*)    All other components within normal limits  LIPASE, BLOOD - Abnormal; Notable for the following components:   Lipase 255 (*)    All other components within normal limits  LACTIC ACID, PLASMA - Abnormal; Notable for the following components:   Lactic Acid, Venous 3.9 (*)    All other components within normal limits  LACTIC ACID, PLASMA - Abnormal; Notable for the following components:   Lactic Acid, Venous 2.2 (*)    All other components within normal limits  CBC WITH DIFFERENTIAL/PLATELET - Abnormal; Notable for the following components:   WBC 15.0 (*)    MCV 103.4 (*)    MCH 35.0 (*)    Neutro Abs 12.9 (*)    All other components within normal limits  TROPONIN I (HIGH SENSITIVITY)  TROPONIN I (HIGH SENSITIVITY)  CBC WITH DIFFERENTIAL/PLATELET    EKG EKG Interpretation  Date/Time:  Friday September 28 2018 09:39:47 EDT Ventricular Rate:  52 PR Interval:    QRS Duration: 107 QT Interval:  543 QTC Calculation: 506 R Axis:   79 Text  Interpretation:  Sinus rhythm Short PR interval Nonspecific T abnrm, anterolateral leads Prolonged QT interval No old tracing to compare Confirmed by Melene PlanFloyd, Chrystine Frogge [phone]([id number]) on 09/28/2018 9:49:26 AM   Radiology Ct Abdomen Pelvis W Contrast  Result Date: 09/28/2018 CLINICAL DATA:  Chest and epigastric pain since 5 a.m. EXAM: CT ABDOMEN AND PELVIS WITH CONTRAST TECHNIQUE: Multidetector CT imaging of the abdomen and pelvis was performed using the standard protocol following bolus administration of intravenous contrast. CONTRAST:  100mL OMNIPAQUE IOHEXOL 300  MG/ML  SOLN COMPARISON:  None. FINDINGS: Lower chest: No acute abnormality. Hepatobiliary: Diffuse low density of the liver is identified. No focal liver lesion is noted. The gallbladder is normal. The biliary tree is normal. Pancreas: There is fluid inflammation surrounding the pancreas. There is no evidence of pancreatic pseudocyst or pancreatic necrosis. Spleen: Normal in size without focal abnormality. Adrenals/Urinary Tract: Adrenal glands are unremarkable. Kidneys are normal, without renal calculi, focal lesion, or hydronephrosis. Bladder is unremarkable. Stomach/Bowel: Stomach is within normal limits. Appendix appears normal. No evidence of bowel wall thickening, distention, or inflammatory changes. There is diverticulosis of colon. Vascular/Lymphatic: Aortic atherosclerosis. No enlarged abdominal or pelvic lymph nodes. Reproductive: Prostate is unremarkable. Other: Minimal ascites is noted in the pelvis. Musculoskeletal: Degenerative joint changes of spine are noted. IMPRESSION: Findings consistent with pancreatitis. There is no evidence of pancreatic pseudocyst or pancreatic necrosis. Fatty infiltration of liver. Electronically Signed   By: Abelardo Diesel M.D.   On: 09/28/2018 12:16    Procedures Procedures (including critical care time)  Medications Ordered in ED Medications  morphine 4 MG/ML injection 8 mg (8 mg Intravenous Given 09/28/18 0955)   ondansetron (ZOFRAN) injection 4 mg (4 mg Intravenous Given 09/28/18 0954)  sodium chloride 0.9 % bolus 1,000 mL (0 mLs Intravenous Stopped 09/28/18 1108)  iohexol (OMNIPAQUE) 300 MG/ML solution 100 mL (100 mLs Intravenous Contrast Given 09/28/18 1142)  morphine 4 MG/ML injection 8 mg (8 mg Intravenous Given 09/28/18 1256)     Initial Impression / Assessment and Plan / ED Course  I have reviewed the triage vital signs and the nursing notes.  Pertinent labs & imaging results that were available during my care of the patient were reviewed by me and considered in my medical decision making (see chart for details).        58 yo M with a chief complaint of epigastric abdominal pain.  Feels somewhat like his prior pancreatitis.  Focally tender to the epigastrium.  His EKG with a sinus bradycardia with hyperacute T waves.  We will send off a troponin.  CBC CMP lipase.  Pain and nausea medicine.  Bolus of IV fluids.  Reassess.  Lab work consistent with pancreatitis.  With severe abdominal pain and subjective fevers and chills will obtain a CT scan.  Initial lactate almost 4.  Repeat trending downward significantly at 2.2.  Patient is feeling better on my reassessment.  CT scan with acute pancreatitis without any noted biliary pathology.  LFTs are normal.  Lipase is in the 200s.  I discussed inpatient versus outpatient care and the patient at this time is electing for outpatient therapy.  Will discharge with a short course of narcotics and antiemetics.  The patient does state that he has continued to drink alcohol but has cut down quite a bit.  Now says he drinks about 2 drinks a day.  1:35 PM:  I have discussed the diagnosis/risks/treatment options with the patient and believe the pt to be eligible for discharge home to follow-up with PCP, GI. We also discussed returning to the ED immediately if new or worsening sx occur. We discussed the sx which are most concerning (e.g., sudden worsening pain, fever,  inability to tolerate by mouth) that necessitate immediate return. Medications administered to the patient during their visit and any new prescriptions provided to the patient are listed below.  Medications given during this visit Medications  morphine 4 MG/ML injection 8 mg (8 mg Intravenous Given 09/28/18 0955)  ondansetron (ZOFRAN) injection 4 mg (  4 mg Intravenous Given 09/28/18 0954)  sodium chloride 0.9 % bolus 1,000 mL (0 mLs Intravenous Stopped 09/28/18 1108)  iohexol (OMNIPAQUE) 300 MG/ML solution 100 mL (100 mLs Intravenous Contrast Given 09/28/18 1142)  morphine 4 MG/ML injection 8 mg (8 mg Intravenous Given 09/28/18 1256)     The patient appears reasonably screen and/or stabilized for discharge and I doubt any other medical condition or other [facility] requiring further screening, evaluation, or treatment in the ED at this time prior to discharge.    Final Clinical Impressions(s) / ED Diagnoses   Final diagnoses:  Alcohol-induced acute pancreatitis without infection or necrosis    ED Discharge Orders         Ordered    morphine (MSIR) 15 MG tablet  Every 4 hours PRN     09/28/18 1332    ondansetron (ZOFRAN ODT) 4 MG disintegrating tablet  Every 8 hours PRN     09/28/18 1332           Melene PlanFloyd, Sholonda Jobst, DO 09/28/18 1335

## 2018-09-28 NOTE — ED Notes (Signed)
Patient verbalizes understanding of discharge instructions. Opportunity for questioning and answers were provided. Pt discharged from ED. 

## 2018-09-28 NOTE — ED Triage Notes (Signed)
Pt from home via ems; c/o CP and epigastric pain that began this 0500 am, N/V; ate BBQ last night; denies medical hx; pain described as sharp/stabbing; denies diarrhea; denies sick contacts, denies cough, fever; pt give 324 asa and 1 nitro PTA; no relief  HR 48-52 sinus arrythmia 132/80 RR 18-20 99% RA CBG 182

## 2018-09-30 ENCOUNTER — Inpatient Hospital Stay (HOSPITAL_COMMUNITY)
Admission: EM | Admit: 2018-09-30 | Discharge: 2018-10-27 | DRG: 871 | Disposition: E | Payer: Self-pay | Attending: Internal Medicine | Admitting: Internal Medicine

## 2018-09-30 ENCOUNTER — Emergency Department (HOSPITAL_COMMUNITY): Payer: Self-pay

## 2018-09-30 ENCOUNTER — Other Ambulatory Visit: Payer: Self-pay

## 2018-09-30 ENCOUNTER — Encounter (HOSPITAL_COMMUNITY): Payer: Self-pay | Admitting: Emergency Medicine

## 2018-09-30 ENCOUNTER — Inpatient Hospital Stay (HOSPITAL_COMMUNITY): Payer: Self-pay

## 2018-09-30 DIAGNOSIS — R402322 Coma scale, best motor response, extension, at arrival to emergency department: Secondary | ICD-10-CM | POA: Diagnosis present

## 2018-09-30 DIAGNOSIS — R402212 Coma scale, best verbal response, none, at arrival to emergency department: Secondary | ICD-10-CM | POA: Diagnosis present

## 2018-09-30 DIAGNOSIS — F17200 Nicotine dependence, unspecified, uncomplicated: Secondary | ICD-10-CM | POA: Diagnosis present

## 2018-09-30 DIAGNOSIS — J939 Pneumothorax, unspecified: Secondary | ICD-10-CM

## 2018-09-30 DIAGNOSIS — E871 Hypo-osmolality and hyponatremia: Secondary | ICD-10-CM | POA: Diagnosis present

## 2018-09-30 DIAGNOSIS — E875 Hyperkalemia: Secondary | ICD-10-CM | POA: Diagnosis present

## 2018-09-30 DIAGNOSIS — R578 Other shock: Secondary | ICD-10-CM | POA: Diagnosis present

## 2018-09-30 DIAGNOSIS — K7200 Acute and subacute hepatic failure without coma: Secondary | ICD-10-CM | POA: Diagnosis present

## 2018-09-30 DIAGNOSIS — E872 Acidosis: Secondary | ICD-10-CM | POA: Diagnosis present

## 2018-09-30 DIAGNOSIS — J9601 Acute respiratory failure with hypoxia: Secondary | ICD-10-CM | POA: Diagnosis present

## 2018-09-30 DIAGNOSIS — N179 Acute kidney failure, unspecified: Secondary | ICD-10-CM | POA: Diagnosis present

## 2018-09-30 DIAGNOSIS — F1010 Alcohol abuse, uncomplicated: Secondary | ICD-10-CM | POA: Diagnosis present

## 2018-09-30 DIAGNOSIS — K859 Acute pancreatitis without necrosis or infection, unspecified: Secondary | ICD-10-CM | POA: Diagnosis present

## 2018-09-30 DIAGNOSIS — J930 Spontaneous tension pneumothorax: Secondary | ICD-10-CM | POA: Diagnosis present

## 2018-09-30 DIAGNOSIS — D696 Thrombocytopenia, unspecified: Secondary | ICD-10-CM | POA: Diagnosis present

## 2018-09-30 DIAGNOSIS — A419 Sepsis, unspecified organism: Principal | ICD-10-CM | POA: Diagnosis present

## 2018-09-30 DIAGNOSIS — Z66 Do not resuscitate: Secondary | ICD-10-CM | POA: Diagnosis present

## 2018-09-30 DIAGNOSIS — Z1159 Encounter for screening for other viral diseases: Secondary | ICD-10-CM

## 2018-09-30 DIAGNOSIS — E162 Hypoglycemia, unspecified: Secondary | ICD-10-CM

## 2018-09-30 DIAGNOSIS — R402142 Coma scale, eyes open, spontaneous, at arrival to emergency department: Secondary | ICD-10-CM | POA: Diagnosis present

## 2018-09-30 DIAGNOSIS — I959 Hypotension, unspecified: Secondary | ICD-10-CM

## 2018-09-30 DIAGNOSIS — I469 Cardiac arrest, cause unspecified: Secondary | ICD-10-CM

## 2018-09-30 LAB — COMPREHENSIVE METABOLIC PANEL
ALT: 109 U/L — ABNORMAL HIGH (ref 0–44)
AST: 360 U/L — ABNORMAL HIGH (ref 15–41)
Albumin: 1.5 g/dL — ABNORMAL LOW (ref 3.5–5.0)
Alkaline Phosphatase: 66 U/L (ref 38–126)
Anion gap: 22 — ABNORMAL HIGH (ref 5–15)
BUN: 59 mg/dL — ABNORMAL HIGH (ref 6–20)
CO2: 13 mmol/L — ABNORMAL LOW (ref 22–32)
Calcium: 6.9 mg/dL — ABNORMAL LOW (ref 8.9–10.3)
Chloride: 93 mmol/L — ABNORMAL LOW (ref 98–111)
Creatinine, Ser: 4.74 mg/dL — ABNORMAL HIGH (ref 0.61–1.24)
GFR calc Af Amer: 15 mL/min — ABNORMAL LOW (ref >60)
GFR calc non Af Amer: 13 mL/min — ABNORMAL LOW (ref >60)
Glucose, Bld: 502 mg/dL (ref 70–99)
Potassium: 6.5 mmol/L (ref 3.5–5.1)
Sodium: 128 mmol/L — ABNORMAL LOW (ref 135–145)
Total Bilirubin: 1.8 mg/dL — ABNORMAL HIGH (ref 0.3–1.2)
Total Protein: 3.9 g/dL — ABNORMAL LOW (ref 6.5–8.1)

## 2018-09-30 LAB — POCT I-STAT EG7
Acid-base deficit: 14 mmol/L — ABNORMAL HIGH (ref 0.0–2.0)
Bicarbonate: 16.3 mmol/L — ABNORMAL LOW (ref 20.0–28.0)
Calcium, Ion: 1.01 mmol/L — ABNORMAL LOW (ref 1.15–1.40)
HCT: 45 % (ref 39.0–52.0)
Hemoglobin: 15.3 g/dL (ref 13.0–17.0)
O2 Saturation: 61 %
Potassium: 6.8 mmol/L (ref 3.5–5.1)
Sodium: 126 mmol/L — ABNORMAL LOW (ref 135–145)
TCO2: 18 mmol/L — ABNORMAL LOW (ref 22–32)
pCO2, Ven: 52.6 mmHg (ref 44.0–60.0)
pH, Ven: 7.098 — CL (ref 7.250–7.430)
pO2, Ven: 43 mmHg (ref 32.0–45.0)

## 2018-09-30 LAB — I-STAT CHEM 8, ED
BUN: 61 mg/dL — ABNORMAL HIGH (ref 6–20)
Calcium, Ion: 0.9 mmol/L — ABNORMAL LOW (ref 1.15–1.40)
Chloride: 95 mmol/L — ABNORMAL LOW (ref 98–111)
Creatinine, Ser: 4.2 mg/dL — ABNORMAL HIGH (ref 0.61–1.24)
Glucose, Bld: 478 mg/dL — ABNORMAL HIGH (ref 70–99)
HCT: 41 % (ref 39.0–52.0)
Hemoglobin: 13.9 g/dL (ref 13.0–17.0)
Potassium: 6.3 mmol/L (ref 3.5–5.1)
Sodium: 125 mmol/L — ABNORMAL LOW (ref 135–145)
TCO2: 17 mmol/L — ABNORMAL LOW (ref 22–32)

## 2018-09-30 LAB — CBC WITH DIFFERENTIAL/PLATELET
Abs Immature Granulocytes: 0.71 K/uL — ABNORMAL HIGH (ref 0.00–0.07)
Basophils Absolute: 0.1 K/uL (ref 0.0–0.1)
Basophils Relative: 1 %
Eosinophils Absolute: 0 K/uL (ref 0.0–0.5)
Eosinophils Relative: 0 %
HCT: 41.1 % (ref 39.0–52.0)
Hemoglobin: 13.2 g/dL (ref 13.0–17.0)
Immature Granulocytes: 5 %
Lymphocytes Relative: 7 %
Lymphs Abs: 1 K/uL (ref 0.7–4.0)
MCH: 35.5 pg — ABNORMAL HIGH (ref 26.0–34.0)
MCHC: 32.1 g/dL (ref 30.0–36.0)
MCV: 110.5 fL — ABNORMAL HIGH (ref 80.0–100.0)
Monocytes Absolute: 0.8 K/uL (ref 0.1–1.0)
Monocytes Relative: 6 %
Neutro Abs: 10.9 K/uL — ABNORMAL HIGH (ref 1.7–7.7)
Neutrophils Relative %: 81 %
Platelets: 91 K/uL — ABNORMAL LOW (ref 150–400)
RBC: 3.72 MIL/uL — ABNORMAL LOW (ref 4.22–5.81)
RDW: 15.7 % — ABNORMAL HIGH (ref 11.5–15.5)
WBC: 13.5 K/uL — ABNORMAL HIGH (ref 4.0–10.5)
nRBC: 2.4 % — ABNORMAL HIGH (ref 0.0–0.2)

## 2018-09-30 LAB — PROTIME-INR
INR: 1.9 — ABNORMAL HIGH (ref 0.8–1.2)
INR: 1.9 — ABNORMAL HIGH (ref 0.8–1.2)
Prothrombin Time: 21.7 seconds — ABNORMAL HIGH (ref 11.4–15.2)
Prothrombin Time: 21.5 seconds — ABNORMAL HIGH (ref 11.4–15.2)

## 2018-09-30 LAB — CBG MONITORING, ED
Glucose-Capillary: 22 mg/dL — CL (ref 70–99)
Glucose-Capillary: 332 mg/dL — ABNORMAL HIGH (ref 70–99)
Glucose-Capillary: 48 mg/dL — ABNORMAL LOW (ref 70–99)
Glucose-Capillary: 448 mg/dL — ABNORMAL HIGH (ref 70–99)

## 2018-09-30 LAB — POCT I-STAT 7, (LYTES, BLD GAS, ICA,H+H)
Acid-base deficit: 8 mmol/L — ABNORMAL HIGH (ref 0.0–2.0)
Acid-base deficit: 8 mmol/L — ABNORMAL HIGH (ref 0.0–2.0)
Bicarbonate: 20.4 mmol/L (ref 20.0–28.0)
Bicarbonate: 21.4 mmol/L (ref 20.0–28.0)
Calcium, Ion: 1.15 mmol/L (ref 1.15–1.40)
Calcium, Ion: 0.99 mmol/L — ABNORMAL LOW (ref 1.15–1.40)
HCT: 46 % (ref 39.0–52.0)
HCT: 47 % (ref 39.0–52.0)
Hemoglobin: 15.6 g/dL (ref 13.0–17.0)
Hemoglobin: 16 g/dL (ref 13.0–17.0)
O2 Saturation: 100 %
O2 Saturation: 98 %
Patient temperature: 35.6
Patient temperature: 37.4
Potassium: 4.7 mmol/L (ref 3.5–5.1)
Potassium: 5.3 mmol/L — ABNORMAL HIGH (ref 3.5–5.1)
Sodium: 132 mmol/L — ABNORMAL LOW (ref 135–145)
Sodium: 136 mmol/L (ref 135–145)
TCO2: 22 mmol/L (ref 22–32)
TCO2: 23 mmol/L (ref 22–32)
pCO2 arterial: 46.8 mmHg (ref 32.0–48.0)
pCO2 arterial: 59.2 mmHg — ABNORMAL HIGH (ref 32.0–48.0)
pH, Arterial: 7.24 — ABNORMAL LOW (ref 7.350–7.450)
pH, Arterial: 7.168 — CL (ref 7.350–7.450)
pO2, Arterial: 438 mmHg — ABNORMAL HIGH (ref 83.0–108.0)
pO2, Arterial: 143 mmHg — ABNORMAL HIGH (ref 83.0–108.0)

## 2018-09-30 LAB — CBC
HCT: 46 % (ref 39.0–52.0)
HCT: 45 % (ref 39.0–52.0)
HCT: 41.8 % (ref 39.0–52.0)
Hemoglobin: 15.4 g/dL (ref 13.0–17.0)
Hemoglobin: 15.1 g/dL (ref 13.0–17.0)
Hemoglobin: 13.8 g/dL (ref 13.0–17.0)
MCH: 35.3 pg — ABNORMAL HIGH (ref 26.0–34.0)
MCH: 35.7 pg — ABNORMAL HIGH (ref 26.0–34.0)
MCH: 36.4 pg — ABNORMAL HIGH (ref 26.0–34.0)
MCHC: 33.5 g/dL (ref 30.0–36.0)
MCHC: 33.6 g/dL (ref 30.0–36.0)
MCHC: 33 g/dL (ref 30.0–36.0)
MCV: 105.5 fL — ABNORMAL HIGH (ref 80.0–100.0)
MCV: 106.4 fL — ABNORMAL HIGH (ref 80.0–100.0)
MCV: 110.3 fL — ABNORMAL HIGH (ref 80.0–100.0)
Platelets: 88 10*3/uL — ABNORMAL LOW (ref 150–400)
Platelets: 80 10*3/uL — ABNORMAL LOW (ref 150–400)
Platelets: 53 10*3/uL — ABNORMAL LOW (ref 150–400)
RBC: 4.36 MIL/uL (ref 4.22–5.81)
RBC: 4.23 MIL/uL (ref 4.22–5.81)
RBC: 3.79 MIL/uL — ABNORMAL LOW (ref 4.22–5.81)
RDW: 15.7 % — ABNORMAL HIGH (ref 11.5–15.5)
RDW: 15.8 % — ABNORMAL HIGH (ref 11.5–15.5)
RDW: 16 % — ABNORMAL HIGH (ref 11.5–15.5)
WBC: 7.6 10*3/uL (ref 4.0–10.5)
WBC: 8.1 10*3/uL (ref 4.0–10.5)
WBC: 7.6 10*3/uL (ref 4.0–10.5)
nRBC: 3.8 % — ABNORMAL HIGH (ref 0.0–0.2)
nRBC: 2.7 % — ABNORMAL HIGH (ref 0.0–0.2)
nRBC: 2.8 % — ABNORMAL HIGH (ref 0.0–0.2)

## 2018-09-30 LAB — BASIC METABOLIC PANEL
Anion gap: 19 — ABNORMAL HIGH (ref 5–15)
BUN: 60 mg/dL — ABNORMAL HIGH (ref 6–20)
CO2: 17 mmol/L — ABNORMAL LOW (ref 22–32)
Calcium: 8.6 mg/dL — ABNORMAL LOW (ref 8.9–10.3)
Chloride: 97 mmol/L — ABNORMAL LOW (ref 98–111)
Creatinine, Ser: 4.58 mg/dL — ABNORMAL HIGH (ref 0.61–1.24)
GFR calc Af Amer: 15 mL/min — ABNORMAL LOW (ref >60)
GFR calc non Af Amer: 13 mL/min — ABNORMAL LOW (ref >60)
Glucose, Bld: 290 mg/dL — ABNORMAL HIGH (ref 70–99)
Potassium: 4.8 mmol/L (ref 3.5–5.1)
Sodium: 133 mmol/L — ABNORMAL LOW (ref 135–145)

## 2018-09-30 LAB — GLUCOSE, CAPILLARY: Glucose-Capillary: 161 mg/dL — ABNORMAL HIGH (ref 70–99)

## 2018-09-30 LAB — TYPE AND SCREEN
ABO/RH(D): O POS
Antibody Screen: NEGATIVE

## 2018-09-30 LAB — AMYLASE: Amylase: 1040 U/L — ABNORMAL HIGH (ref 28–100)

## 2018-09-30 LAB — SARS CORONAVIRUS 2 BY RT PCR (HOSPITAL ORDER, PERFORMED IN ~~LOC~~ HOSPITAL LAB): SARS Coronavirus 2: NEGATIVE

## 2018-09-30 LAB — MRSA PCR SCREENING: MRSA by PCR: NEGATIVE

## 2018-09-30 LAB — LIPASE, BLOOD: Lipase: 685 U/L — ABNORMAL HIGH (ref 11–51)

## 2018-09-30 LAB — TROPONIN I (HIGH SENSITIVITY): Troponin I (High Sensitivity): 63 ng/L — ABNORMAL HIGH

## 2018-09-30 LAB — LACTIC ACID, PLASMA: Lactic Acid, Venous: >11 mmol/L (ref 0.5–1.9)

## 2018-09-30 LAB — CK: Total CK: 709 U/L — ABNORMAL HIGH (ref 49–397)

## 2018-09-30 MED ORDER — PROPOFOL 1000 MG/100ML IV EMUL
5.0000 ug/kg/min | INTRAVENOUS | Status: DC
  Administered 2018-09-30 (×2): 20 ug/kg/min via INTRAVENOUS

## 2018-09-30 MED ORDER — ONDANSETRON HCL 4 MG/2ML IJ SOLN: 4.0000 mg | Freq: Four times a day (QID) | INTRAMUSCULAR | Status: DC | PRN

## 2018-09-30 MED ORDER — FENTANYL CITRATE (PF) 100 MCG/2ML IJ SOLN: 25.0000 ug | INTRAMUSCULAR | Status: DC | PRN

## 2018-09-30 MED ORDER — ORAL CARE MOUTH RINSE
15.0000 mL | OROMUCOSAL | Status: DC
  Administered 2018-09-30 (×2): 15 mL via OROMUCOSAL

## 2018-09-30 MED ORDER — DEXMEDETOMIDINE HCL IN NACL 200 MCG/50ML IV SOLN: 0.2000 ug/kg/h | INTRAVENOUS | Status: DC

## 2018-09-30 MED ORDER — CHLORHEXIDINE GLUCONATE 0.12 % MT SOLN
OROMUCOSAL | Status: AC
  Administered 2018-09-30: 15 mL via OROMUCOSAL
  Filled 2018-09-30: qty 15

## 2018-09-30 MED ORDER — FAMOTIDINE IN NACL 20-0.9 MG/50ML-% IV SOLN
20.0000 mg | INTRAVENOUS | Status: DC
  Administered 2018-09-30: 20 mg via INTRAVENOUS

## 2018-09-30 MED ORDER — ADULT MULTIVITAMIN LIQUID CH: 15.0000 mL | Freq: Every day | ORAL | Status: DC

## 2018-09-30 MED ORDER — SODIUM BICARBONATE 8.4 % IV SOLN
INTRAVENOUS | Status: DC | PRN
  Administered 2018-09-30: 100 meq via INTRAVENOUS

## 2018-09-30 MED ORDER — HEPARIN (PORCINE) 2000 UNITS/L FOR CRRT
INTRAVENOUS_CENTRAL | Status: DC | PRN
  Filled 2018-09-30: qty 1000

## 2018-09-30 MED ORDER — PRISMASOL BGK 0/2.5 32-2.5 MEQ/L IV SOLN
INTRAVENOUS | Status: DC
  Administered 2018-09-30: 19:00:00 via INTRAVENOUS_CENTRAL
  Filled 2018-09-30 (×2): qty 5000

## 2018-09-30 MED ORDER — DEXTROSE 50 % IV SOLN
25.0000 g | Freq: Once | INTRAVENOUS | Status: AC
  Administered 2018-09-30: 25 g via INTRAVENOUS

## 2018-09-30 MED ORDER — MIDAZOLAM HCL 2 MG/2ML IJ SOLN: 1.0000 mg | INTRAMUSCULAR | Status: DC | PRN

## 2018-09-30 MED ORDER — IPRATROPIUM-ALBUTEROL 0.5-2.5 (3) MG/3ML IN SOLN
3.0000 mL | Freq: Four times a day (QID) | RESPIRATORY_TRACT | Status: DC
  Administered 2018-09-30: 3 mL via RESPIRATORY_TRACT
  Filled 2018-09-30: qty 3

## 2018-09-30 MED ORDER — SODIUM BICARBONATE 8.4 % IV SOLN
200.0000 meq | Freq: Once | INTRAVENOUS | Status: AC
  Administered 2018-09-30: 200 meq via INTRAVENOUS

## 2018-09-30 MED ORDER — FENTANYL CITRATE (PF) 100 MCG/2ML IJ SOLN: 50.0000 ug | INTRAMUSCULAR | Status: DC | PRN

## 2018-09-30 MED ORDER — SODIUM ZIRCONIUM CYCLOSILICATE 10 G PO PACK
10.0000 g | PACK | Freq: Three times a day (TID) | ORAL | Status: DC
  Filled 2018-09-30 (×2): qty 1

## 2018-09-30 MED ORDER — SODIUM CHLORIDE 0.9 % IV SOLN
INTRAVENOUS | Status: DC
  Administered 2018-09-30: 18:00:00 via INTRAVENOUS

## 2018-09-30 MED ORDER — ALBUTEROL SULFATE (2.5 MG/3ML) 0.083% IN NEBU
10.0000 mg | INHALATION_SOLUTION | Freq: Once | RESPIRATORY_TRACT | Status: AC
  Administered 2018-09-30: 10 mg via RESPIRATORY_TRACT
  Filled 2018-09-30: qty 12

## 2018-09-30 MED ORDER — PROPOFOL 1000 MG/100ML IV EMUL
INTRAVENOUS | Status: AC
  Filled 2018-09-30: qty 100

## 2018-09-30 MED ORDER — FENTANYL 2500MCG IN NS 250ML (10MCG/ML) PREMIX INFUSION: 0.0000 ug/h | INTRAVENOUS | Status: DC

## 2018-09-30 MED ORDER — FOLIC ACID 5 MG/ML IJ SOLN
1.0000 mg | Freq: Every day | INTRAMUSCULAR | Status: DC
  Filled 2018-09-30 (×2): qty 0.2

## 2018-09-30 MED ORDER — VASOPRESSIN 20 UNIT/ML IV SOLN
0.0300 [IU]/min | INTRAVENOUS | Status: DC
  Administered 2018-09-30 (×2): 0.03 [IU]/min via INTRAVENOUS
  Filled 2018-09-30: qty 2

## 2018-09-30 MED ORDER — CALCIUM CHLORIDE 10 % IV SOLN
INTRAVENOUS | Status: DC | PRN
  Administered 2018-09-30: 1 g via INTRAVENOUS

## 2018-09-30 MED ORDER — STERILE WATER FOR INJECTION IV SOLN
INTRAVENOUS | Status: DC
  Administered 2018-09-30: 13:00:00 via INTRAVENOUS
  Filled 2018-09-30 (×2): qty 850

## 2018-09-30 MED ORDER — MIDAZOLAM HCL 2 MG/2ML IJ SOLN: 2.0000 mg | INTRAMUSCULAR | Status: DC | PRN

## 2018-09-30 MED ORDER — CHLORHEXIDINE GLUCONATE 0.12% ORAL RINSE (MEDLINE KIT)
15.0000 mL | Freq: Two times a day (BID) | OROMUCOSAL | Status: DC
  Administered 2018-09-30 (×2): 15 mL via OROMUCOSAL

## 2018-09-30 MED ORDER — SODIUM BICARBONATE 8.4 % IV SOLN
Freq: Once | INTRAVENOUS | Status: DC
  Administered 2018-09-30: 12:00:00 via INTRAVENOUS
  Filled 2018-09-30: qty 100

## 2018-09-30 MED ORDER — ROCURONIUM BROMIDE 50 MG/5ML IV SOLN
INTRAVENOUS | Status: DC | PRN
  Administered 2018-09-30: 50 mg via INTRAVENOUS

## 2018-09-30 MED ORDER — CALCIUM GLUCONATE-NACL 1-0.675 GM/50ML-% IV SOLN
1.0000 g | Freq: Once | INTRAVENOUS | Status: AC
  Administered 2018-09-30: 1000 mg via INTRAVENOUS
  Filled 2018-09-30: qty 50

## 2018-09-30 MED ORDER — SODIUM CHLORIDE 0.9 % IV SOLN: 250.0000 mL | INTRAVENOUS | Status: DC

## 2018-09-30 MED ORDER — SODIUM BICARBONATE 8.4 % IV SOLN
INTRAVENOUS | Status: AC
  Filled 2018-09-30: qty 200

## 2018-09-30 MED ORDER — PRISMASOL BGK 4/2.5 32-4-2.5 MEQ/L IV SOLN
INTRAVENOUS | Status: DC
  Administered 2018-09-30 (×2): via INTRAVENOUS_CENTRAL
  Filled 2018-09-30 (×7): qty 5000

## 2018-09-30 MED ORDER — SODIUM CHLORIDE 0.9% FLUSH
3.0000 mL | Freq: Once | INTRAVENOUS | Status: AC
  Administered 2018-09-30: 3 mL via INTRAVENOUS

## 2018-09-30 MED ORDER — SODIUM CHLORIDE 0.9 % IV BOLUS
1000.0000 mL | Freq: Once | INTRAVENOUS | Status: AC
  Administered 2018-09-30: 1000 mL via INTRAVENOUS

## 2018-09-30 MED ORDER — HEPARIN SODIUM (PORCINE) 1000 UNIT/ML DIALYSIS
1000.0000 [IU] | INTRAMUSCULAR | Status: DC | PRN
  Filled 2018-09-30: qty 6

## 2018-09-30 MED ORDER — THIAMINE HCL 100 MG/ML IJ SOLN: 100.0000 mg | Freq: Every day | INTRAMUSCULAR | Status: DC

## 2018-09-30 MED ORDER — NOREPINEPHRINE 16 MG/250ML-% IV SOLN
5.0000 ug/min | INTRAVENOUS | Status: DC
  Administered 2018-09-30: 20 ug/min via INTRAVENOUS
  Filled 2018-09-30: qty 250

## 2018-09-30 MED ORDER — NOREPINEPHRINE 4 MG/250ML-% IV SOLN
0.0000 ug/min | INTRAVENOUS | Status: DC
  Administered 2018-09-30: 10 ug/min via INTRAVENOUS
  Administered 2018-09-30: 20 ug/min via INTRAVENOUS
  Filled 2018-09-30: qty 250

## 2018-09-30 MED ORDER — NOREPINEPHRINE 4 MG/250ML-% IV SOLN
5.0000 ug/min | INTRAVENOUS | Status: DC
  Administered 2018-09-30: 30 ug/min via INTRAVENOUS
  Filled 2018-09-30 (×3): qty 250

## 2018-09-30 MED ORDER — ACETAMINOPHEN 325 MG PO TABS: 650.0000 mg | ORAL_TABLET | ORAL | Status: DC | PRN

## 2018-09-30 MED ORDER — FAMOTIDINE IN NACL 20-0.9 MG/50ML-% IV SOLN
20.0000 mg | Freq: Two times a day (BID) | INTRAVENOUS | Status: DC
  Filled 2018-09-30: qty 50

## 2018-09-30 MED ORDER — HYDROCORTISONE NA SUCCINATE PF 100 MG IJ SOLR
50.0000 mg | Freq: Four times a day (QID) | INTRAMUSCULAR | Status: DC
  Administered 2018-09-30: 50 mg via INTRAVENOUS
  Filled 2018-09-30: qty 2

## 2018-09-30 MED ORDER — HEPARIN SODIUM (PORCINE) 5000 UNIT/ML IJ SOLN: 5000.0000 [IU] | Freq: Three times a day (TID) | INTRAMUSCULAR | Status: DC

## 2018-09-30 MED ORDER — PRISMASOL BGK 0/2.5 32-2.5 MEQ/L IV SOLN
INTRAVENOUS | Status: DC
  Administered 2018-09-30: 19:00:00 via INTRAVENOUS_CENTRAL
  Filled 2018-09-30 (×3): qty 5000

## 2018-09-30 MED ORDER — DEXTROSE 50 % IV SOLN
INTRAVENOUS | Status: AC
  Administered 2018-09-30: 10:00:00
  Filled 2018-09-30: qty 100

## 2018-10-01 MED FILL — Medication: Qty: 1 | Status: AC

## 2018-10-02 ENCOUNTER — Telehealth: Payer: Self-pay

## 2018-10-02 LAB — HIV ANTIBODY (ROUTINE TESTING W REFLEX): HIV Screen 4th Generation wRfx: NONREACTIVE

## 2018-10-02 LAB — PATHOLOGIST SMEAR REVIEW

## 2018-10-02 NOTE — Telephone Encounter (Signed)
Received dc from [employer].. DC is for burial and a patient of Doctor Tamala Julian.   DC will be taken to Pulmonary Unit for signature.  On 10/04/2018 Received signed dc back from Doctor Melvyn Novas who signed the dc for Doctor Tamala Julian. I called the funeral home to let them know the dc is ready for pickup.

## 2018-10-04 LAB — GLUCOSE, CAPILLARY: Glucose-Capillary: 22 mg/dL — CL (ref 70–99)

## 2018-10-05 LAB — CULTURE, BLOOD (ROUTINE X 2)
Culture: NO GROWTH
Culture: NO GROWTH
Special Requests: ADEQUATE
Special Requests: ADEQUATE

## 2018-10-06 NOTE — Discharge Summary (Signed)
Date of Admission: 2018/10/03  Date of Death: 10-03-2018  Diagnoses: Multiorgan failure after cardiac arrest due to combination of hyperkalemia and hypoglycemia  Hospital Course: Patient admitted after out of hospital cardiac arrest.  Unfortunately he continued to decline after ROSC and went into multiorgan failure with profound acidosis.  We did a trial of salvage CRRT but patient passed.  Erskine Emery MD PCCM

## 2018-10-27 NOTE — ED Triage Notes (Signed)
Pt BIB [facility] for post CPR. EMS reports they pt was hypoglycemic at 33, ems administered D50 and narcan due to narcotic usage. EMS reports the pt went into cardiac arrest in their presence and had a period of only 10 minutes of CPR before ROSC. EMS reports pt was cyanotic on their arrival. EMS reports pt's pupils were pinpoint, fixed, and non-reactive. EMS reports the pt was recently diagnosed on Friday with pancreatitis. EMS reports a total of 5 epi's for cardiac arrest. EMS advised 12 lead unremarkable and no blood pressures while in route.

## 2018-10-27 NOTE — Consult Note (Signed)
Reason for Consult: ARF Referring Physician:  Tamala Julian, MD  Daniel Abbott is an 58 y.o. male.  HPI: Daniel Abbott is a 58 yo WM with PMH significant for alcohol abuse, h/o pancreatitis, and tobacco abuse who suffered a witnessed cardiac arrest requiring 10 minutes of CPR before ROSC.  He was intubated by EMS and transported to [facility] ED for further evaluation.  He was noted to be hypoglycemic with glucose in the 30's and given D50 IVP and started on D10 drip.  In the ED he was noted to be hypotensive and started on pressors.  CXR revealed left pneumothorax and chest tube was placed.  Initial labs were notable for ARF, hyperkalemia, metabolic acidosis, hyponatremia, thrombocytopenia, leukocystosis, and elevated liver enzymes and albumin of 1.5.  We were consulted for possible CVVHD in light of his ARF and electrolyte abnormalities.  PCCM discussed the poor prognosis with the family and he is currently DNR but they are willing to try CVVHD to see if this will help.  Trend in Creatinine: Creatinine, Ser  Date/Time Value Ref Range Status  2018/10/02 03:14 PM 4.58 (H) 0.61 - 1.24 mg/dL Final  10/02/18 11:42 AM 4.20 (H) 0.61 - 1.24 mg/dL Final  10-02-2018 11:11 AM 4.74 (H) 0.61 - 1.24 mg/dL Final  09/28/2018 09:50 AM 1.10 0.61 - 1.24 mg/dL Final  06/09/2017 12:38 AM 0.93 0.61 - 1.24 mg/dL Final  07/09/2016 03:20 AM 0.85 0.61 - 1.24 mg/dL Final  07/08/2016 02:23 AM 0.89 0.61 - 1.24 mg/dL Final  07/07/2016 06:51 PM 0.98 0.61 - 1.24 mg/dL Final  09/08/2009 03:15 PM 0.96 0.4 - 1.5 mg/dL Final    PMH:   Past Medical History:  Diagnosis Date  . Alcohol abuse   . Tobacco use disorder     PSH:  History reviewed. No pertinent surgical history.  Allergies: No Known Allergies  Medications:   Prior to Admission medications   Medication Sig Start Date End Date Taking? Authorizing Provider  aspirin 325 MG tablet Take 325 mg by mouth every 6 (six) hours as needed for mild pain.   Yes [provider]   morphine (MSIR) 15 MG tablet Take 1 tablet (15 mg total) by mouth every 4 (four) hours as needed for severe pain. 09/28/18  Yes Deno Etienne, DO  ondansetron (ZOFRAN ODT) 4 MG disintegrating tablet Take 1 tablet (4 mg total) by mouth every 8 (eight) hours as needed for nausea or vomiting. 09/28/18  Yes Deno Etienne, DO  traMADol (ULTRAM) 50 MG tablet Take 1 tablet (50 mg total) by mouth every 6 (six) hours as needed for moderate pain or severe pain. Patient not taking: Reported on 09/28/2018 06/09/17   Domenic Moras, PA-C    Inpatient medications: . chlorhexidine gluconate (MEDLINE KIT)  15 mL Mouth Rinse BID  . folic acid  1 mg Intravenous Daily  . [START ON 10/01/2018] heparin injection (subcutaneous)  5,000 Units Subcutaneous Q8H  . hydrocortisone sod succinate (SOLU-CORTEF) inj  50 mg Intravenous Q6H  . ipratropium-albuterol  3 mL Nebulization Q6H  . mouth rinse  15 mL Mouth Rinse 10 times per day  . multivitamin  15 mL Oral Daily  . sodium zirconium cyclosilicate  10 g Oral TID  . thiamine injection  100 mg Intravenous Daily    Discontinued Meds:   Medications Discontinued During This Encounter  Medication Reason  . sodium bicarbonate 100 mEq in dextrose 5 % 1,000 mL infusion   . famotidine (PEPCID) IVPB 20 mg premix Duplicate  . norepinephrine (LEVOPHED)  $'4mg'j$  in 283m premix infusion Duplicate  . fentaNYL (SUBLIMAZE) injection 50 mcg   . fentaNYL (SUBLIMAZE) injection 50-200 mcg   . propofol (DIPRIVAN) 1000 MG/100ML infusion     Social History:  reports that he has been smoking. He has never used smokeless tobacco. He reports current alcohol use. He reports that he does not use drugs.  Family History:   Family History  Problem Relation Age of Onset  . Hypertension Mother     Review of systems not obtained due to patient factors. Weight change:   Intake/Output Summary (Last 24 hours) at [id number] Last data filed at 712-Jul-20201028 Gross per 24 hour  Intake 500 ml  Output 0 ml   Net 500 ml   BP 107/88   Pulse (!) 101   Temp (!) 96.3 F (35.7 C)   Resp (!) 30   Ht '5\' 6"'$  (1.676 m)   Wt 65.8 kg   SpO2 100%   BMI 23.41 kg/m  Vitals:   012-Jul-20201615 007-12-201630 007/12/20201645 007/12/20201700  BP:   99/78 107/88  Pulse: (!) 101     Resp: (!) 30 (!) 30 (!) 30 (!) 30  Temp: (!) 95.9 F (35.5 C) (!) 95.9 F (35.5 C) (!) 96.1 F (35.6 C) (!) 96.3 F (35.7 C)  SpO2: 100%     Weight:      Height:         General appearance: intubated and unresponsive Head: Normocephalic, without obvious abnormality, atraumatic Eyes: negative findings: lids and lashes normal and conjunctivae and sclerae normal Resp: diminished breath sounds LLL Chest wall: +crepitus of left chest wall Cardio: tachycardic, no rub GI: soft, non-tender; bowel sounds normal; no masses,  no organomegaly Extremities: no edema, some cyanosis and mottling of lower extremities, cool to touch  Labs: Basic Metabolic Panel: Recent Labs  Lab 09/28/18 0950 007/12/20201111 007-12-201135 007/12/20201142 012-Jul-20201514 007/12/20201523  NA 140 128* 126* 125* 133* 132*  K 3.0* 6.5* 6.8* 6.3* 4.8 4.7  CL 98 93*  --  95* 97*  --   CO2 29 13*  --   --  17*  --   GLUCOSE 176* 502*  --  478* 290*  --   BUN 7 59*  --  61* 60*  --   CREATININE 1.10 4.74*  --  4.20* 4.58*  --   ALBUMIN 3.1* 1.5*  --   --   --   --   CALCIUM 9.4 6.9*  --   --  8.6*  --    Liver Function Tests: Recent Labs  Lab 09/28/18 0950 0Jul 12, 20201111  AST 38 360*  ALT 19 109*  ALKPHOS 68 66  BILITOT 1.2 1.8*  PROT 6.4* 3.9*  ALBUMIN 3.1* 1.5*   Recent Labs  Lab 09/28/18 0950 007-12-201514  LIPASE 255* 685*  AMYLASE  --  1,040*   No results for input(s): AMMONIA in the last 168 hours. CBC: Recent Labs  Lab 09/28/18 1056 0July 12, 20201111 012-Jul-20201135 0Jul 12, 20201142 02020-07-121251 012-Jul-20201523  WBC 15.0* 13.5*  --   --  7.6  --   NEUTROABS 12.9* 10.9*  --   --   --   --   HGB 16.4 13.2 15.3 13.9 15.4 15.6  HCT 48.4 41.1  45.0 41.0 46.0 46.0  MCV 103.4* 110.5*  --   --  105.5*  --   PLT 192 91*  --   --  88*  --  PT/INR: '@LABRCNTIP'$ (inr:5) Cardiac Enzymes: ) Recent Labs  Lab 10/04/2018 1111  CKTOTAL 709*   CBG: Recent Labs  Lab Oct 04, 2018 1003 2018/10/04 1009 October 04, 2018 1018 Oct 04, 2018 1119  GLUCAP 22* 48* 332* 448*    Iron Studies: No results for input(s): IRON, TIBC, TRANSFERRIN, FERRITIN in the last 168 hours.  Xrays/Other Studies: Ct Abdomen Pelvis Wo Contrast  Result Date: Oct 04, 2018 CLINICAL DATA:  Cardiac arrest. Rigid abdomen. Recent diagnosis of pancreatitis. EXAM: CT CHEST, ABDOMEN AND PELVIS WITHOUT CONTRAST TECHNIQUE: Multidetector CT imaging of the chest, abdomen and pelvis was performed following the standard protocol without IV contrast. COMPARISON:  09/28/2018 CT abdomen/pelvis. Chest radiograph from earlier today. FINDINGS: CT CHEST FINDINGS Cardiovascular: Normal heart size. No significant pericardial fluid/thickening. Right internal jugular central venous catheter terminates in the lower third of the SVC. Atherosclerotic nonaneurysmal thoracic aorta. Normal caliber pulmonary arteries. Mediastinum/Nodes: No pneumomediastinum. No mediastinal hematoma. No discrete thyroid nodules. Enteric tube terminates in the distal stomach. Otherwise unremarkable esophagus. No axillary, mediastinal or hilar lymphadenopathy. Lungs/Pleura: Small left hydropneumothorax, approximately 5-10%. Anterior left chest tube in place. No right pneumothorax. Small dependent right pleural effusion. Endotracheal tube tip is 2.5 cm above the carina. Moderate centrilobular emphysema. No central airway stenosis. Mild passive atelectasis at the dependent lung bases bilaterally. Solid left upper lobe 5 mm pulmonary nodule (series 5/image 57). Patchy ground-glass centrilobular micronodularity in both lungs, probably smoking related. Musculoskeletal: No aggressive appearing focal osseous lesions. Nondisplaced bilateral anterior first  through sixth rib fractures. Mild thoracic spondylosis. Extensive subcutaneous emphysema throughout the left chest wall. CT ABDOMEN PELVIS FINDINGS Hepatobiliary: Diffuse hepatic steatosis. No definite liver surface irregularity. No liver mass. Vicarious excretion of IV contrast in the gallbladder, which otherwise appears normal. No biliary ductal dilatation. Pancreas: Prominent diffuse pancreatic thickening and peripancreatic edema compatible with acute pancreatitis, worsened in the interval. No pancreatic mass. No measurable peripancreatic collection. No pancreatic duct dilation. Spleen: Normal size. No mass. Adrenals/Urinary Tract: Normal adrenals. Persistent bilateral patchy contrast nephrograms on this noncontrast scan. No hydronephrosis. No contour deforming renal masses. Bladder collapsed by indwelling Foley catheter and not well evaluated. Stomach/Bowel: Normal non-distended stomach. Normal caliber small bowel with no small bowel wall thickening. Normal appendix. Moderate sigmoid diverticulosis, with no definite large bowel wall thickening. Vascular/Lymphatic: Atherosclerotic nonaneurysmal abdominal aorta. Left common femoral artery approach central arterial catheter terminates in the left external iliac artery. No pathologically enlarged lymph nodes in the abdomen or pelvis. Reproductive: Top-normal size prostate. Other: No pneumoperitoneum, ascites or focal fluid collection. Extensive subcutaneous emphysema throughout the bilateral ventral abdominal wall. Musculoskeletal: No aggressive appearing focal osseous lesions. Marked lumbar spondylosis. IMPRESSION: 1. Bilateral anterior first through sixth acute rib fractures. Small left hydropneumothorax with left chest tube in place. Extensive subcutaneous emphysema throughout the left chest wall and bilateral ventral abdominal wall. 2. Small dependent right pleural effusion. 3. Well-positioned endotracheal tube, enteric tube, right internal jugular central  venous catheter and left common femoral artery approach arterial catheter. 4. Left upper lobe 5 mm solid pulmonary nodule. No follow-up needed if patient is low-risk. Non-contrast chest CT can be considered in 12 months if patient is high-risk. This recommendation follows the consensus statement: Guidelines for Management of Incidental Pulmonary Nodules Detected on CT Images:From the Fleischner Society 2017; published online before print (10.1148/[id number]). 5. Acute pancreatitis, worsened since 09/28/2018 CT. 6. Persistent bilateral patchy contrast nephrograms on this noncontrast scan, indicative of acute renal failure. 7. Diffuse hepatic steatosis. 8. Moderate sigmoid diverticulosis. 9. Aortic Atherosclerosis (ICD10-I70.0) and Emphysema (ICD10-J43.9). Electronically Signed  By: Ilona Sorrel M.D.   On: October 12, 2018 15:36   Ct Head Wo Contrast  Result Date: 10-12-2018 CLINICAL DATA:  Patient status post cardiac arrest.  Region abdomen. EXAM: CT HEAD WITHOUT CONTRAST TECHNIQUE: Contiguous axial images were obtained from the base of the skull through the vertex without intravenous contrast. COMPARISON:  None. FINDINGS: Brain: Ventricles and sulci are appropriate for patient's age. No evidence for acute cortically based infarct, intracranial hemorrhage, mass lesion or mass-effect. Periventricular and subcortical white matter hypodensities compatible with chronic microvascular ischemic changes. Vascular: Unremarkable Skull: Intact. Sinuses/Orbits: Air-fluid levels within the maxillary and sphenoid sinuses. Mucosal thickening frontal sinus and ethmoid air cells. Mastoid air cells are unremarkable. Orbits are unremarkable. Other: Patient is intubated. IMPRESSION: No acute intracranial process. Electronically Signed   By: Lovey Newcomer M.D.   On: 2018-10-12 15:16   Ct Chest Wo Contrast  Result Date: 10-12-2018 CLINICAL DATA:  Cardiac arrest. Rigid abdomen. Recent diagnosis of pancreatitis. EXAM: CT CHEST,  ABDOMEN AND PELVIS WITHOUT CONTRAST TECHNIQUE: Multidetector CT imaging of the chest, abdomen and pelvis was performed following the standard protocol without IV contrast. COMPARISON:  09/28/2018 CT abdomen/pelvis. Chest radiograph from earlier today. FINDINGS: CT CHEST FINDINGS Cardiovascular: Normal heart size. No significant pericardial fluid/thickening. Right internal jugular central venous catheter terminates in the lower third of the SVC. Atherosclerotic nonaneurysmal thoracic aorta. Normal caliber pulmonary arteries. Mediastinum/Nodes: No pneumomediastinum. No mediastinal hematoma. No discrete thyroid nodules. Enteric tube terminates in the distal stomach. Otherwise unremarkable esophagus. No axillary, mediastinal or hilar lymphadenopathy. Lungs/Pleura: Small left hydropneumothorax, approximately 5-10%. Anterior left chest tube in place. No right pneumothorax. Small dependent right pleural effusion. Endotracheal tube tip is 2.5 cm above the carina. Moderate centrilobular emphysema. No central airway stenosis. Mild passive atelectasis at the dependent lung bases bilaterally. Solid left upper lobe 5 mm pulmonary nodule (series 5/image 57). Patchy ground-glass centrilobular micronodularity in both lungs, probably smoking related. Musculoskeletal: No aggressive appearing focal osseous lesions. Nondisplaced bilateral anterior first through sixth rib fractures. Mild thoracic spondylosis. Extensive subcutaneous emphysema throughout the left chest wall. CT ABDOMEN PELVIS FINDINGS Hepatobiliary: Diffuse hepatic steatosis. No definite liver surface irregularity. No liver mass. Vicarious excretion of IV contrast in the gallbladder, which otherwise appears normal. No biliary ductal dilatation. Pancreas: Prominent diffuse pancreatic thickening and peripancreatic edema compatible with acute pancreatitis, worsened in the interval. No pancreatic mass. No measurable peripancreatic collection. No pancreatic duct dilation.  Spleen: Normal size. No mass. Adrenals/Urinary Tract: Normal adrenals. Persistent bilateral patchy contrast nephrograms on this noncontrast scan. No hydronephrosis. No contour deforming renal masses. Bladder collapsed by indwelling Foley catheter and not well evaluated. Stomach/Bowel: Normal non-distended stomach. Normal caliber small bowel with no small bowel wall thickening. Normal appendix. Moderate sigmoid diverticulosis, with no definite large bowel wall thickening. Vascular/Lymphatic: Atherosclerotic nonaneurysmal abdominal aorta. Left common femoral artery approach central arterial catheter terminates in the left external iliac artery. No pathologically enlarged lymph nodes in the abdomen or pelvis. Reproductive: Top-normal size prostate. Other: No pneumoperitoneum, ascites or focal fluid collection. Extensive subcutaneous emphysema throughout the bilateral ventral abdominal wall. Musculoskeletal: No aggressive appearing focal osseous lesions. Marked lumbar spondylosis. IMPRESSION: 1. Bilateral anterior first through sixth acute rib fractures. Small left hydropneumothorax with left chest tube in place. Extensive subcutaneous emphysema throughout the left chest wall and bilateral ventral abdominal wall. 2. Small dependent right pleural effusion. 3. Well-positioned endotracheal tube, enteric tube, right internal jugular central venous catheter and left common femoral artery approach arterial catheter. 4. Left upper lobe 5  mm solid pulmonary nodule. No follow-up needed if patient is low-risk. Non-contrast chest CT can be considered in 12 months if patient is high-risk. This recommendation follows the consensus statement: Guidelines for Management of Incidental Pulmonary Nodules Detected on CT Images:From the Fleischner Society 2017; published online before print (10.1148/[id number]). 5. Acute pancreatitis, worsened since 09/28/2018 CT. 6. Persistent bilateral patchy contrast nephrograms on this  noncontrast scan, indicative of acute renal failure. 7. Diffuse hepatic steatosis. 8. Moderate sigmoid diverticulosis. 9. Aortic Atherosclerosis (ICD10-I70.0) and Emphysema (ICD10-J43.9). Electronically Signed   By: Ilona Sorrel M.D.   On: 10/29/18 15:36   Dg Chest Port 1 View  Result Date: 10/29/2018 CLINICAL DATA:  Follow-up pneumothorax EXAM: PORTABLE CHEST 1 VIEW COMPARISON:  Earlier same day FINDINGS: Endotracheal tube tip is 4 cm above the carina. Nasogastric tube enters the stomach. Right internal jugular central line tip is in the SVC above the right atrium. Left chest tube is repositioned in there is resolution of the left pneumothorax. There is mild atelectasis in the left lower lobe. IMPRESSION: Reposition left chest tube. Resolution of left pneumothorax. Mild atelectasis left lower lobe. Electronically Signed   By: Nelson Chimes M.D.   On: 10/29/18 12:56   Dg Chest Portable 1 View  Result Date: 10/29/18 CLINICAL DATA:  Follow-up left pneumothorax status post chest tube placement EXAM: PORTABLE CHEST 1 VIEW COMPARISON:  Chest radiograph from earlier today. FINDINGS: Basilar left chest tube terminates over left hemidiaphragm. Endotracheal tube tip is 3.8 cm above the carina. Enteric tube enters stomach with the tip not seen on this image. Right internal jugular central venous catheter terminates over the cavoatrial junction. Stable cardiomediastinal silhouette with normal heart size. Large left tension pneumothorax is unchanged with stable right mediastinal shift. No right pneumothorax. No pleural effusion. Complete left lung atelectasis. Clear right lung. IMPRESSION: 1. Persistent large left tension pneumothorax with right mediastinal shift, unchanged status post left basilar chest tube placement. 2. Stable complete left lung atelectasis. 3. Well-positioned endotracheal and enteric tubes. Critical Value/emergent results were called by telephone at the time of interpretation on 29-Oct-2018 at 12:34  pm to Dr. Marda Stalker , who verbally acknowledged these results. Electronically Signed   By: Ilona Sorrel M.D.   On: 10/29/2018 12:35   Dg Chest Portable 1 View  Result Date: 10-29-18 CLINICAL DATA:  Status post CPR. EXAM: PORTABLE CHEST 1 VIEW COMPARISON:  Radiograph of July 07, 2016. FINDINGS: There is interval development of large left pneumothorax with complete atelectasis of the left lung. Endotracheal tube is in good position. Nasogastric tube is seen entering the stomach. Right internal jugular catheter is noted with tip in expected position of SVC. Right lung is clear. Bony thorax is unremarkable. IMPRESSION: Endotracheal and nasogastric tubes are in grossly good position. Large left pneumothorax is noted with complete atelectasis of the left lung. Critical Value/emergent results were called by telephone at the time of interpretation on October 29, 2018 at 11:46 am to Dr. Marda Stalker , who verbally acknowledged these results. Electronically Signed   By: Marijo Conception M.D.   On: 29-Oct-2018 11:48     Assessment/Plan: 1.  ARF in setting of cardiac arrest and ongoing hypotension- agree with trial of CVVHD and monitor for improvement.  Pressors as needed for support.   1. CVVHD orders:  No heparin due to thrombocytopenia, UF goal to keep even for now, Dialysate 4K/2.5Ca, pre and post replacement fluid 2K/2.5Ca dfr 500/300 respectively 2. Hyperkalemia- improved with bicarb and D10 drip 3. Cardiac arrest  with shock- continue pressors.  No more CPR or defib. 4. Acute pancreatitis- per primary 5. Metabolic acidosis- improved with bicarb drip.  Will stop once CVVHD is initiated 6. VDRF- per PCCM 7. Left, tension pneumothorax with crepitus- s/p chest tube.  Unclear if this was spontaneous or developed after CPR. 8. Elevated LFT's- presumably combination of shock liver and Etoh 9. Etoh abuse- active prior to admission.  Withdrawal protocol per PCCM 10. Hypoglycemia- improved with  D10.   Broadus John A Greyden Besecker 2018-10-12, 5:10 PM

## 2018-10-27 NOTE — Procedures (Signed)
Chest Tube Insertion Procedure Note  Indications:  Clinically significant Pneumothorax  Pre-operative Diagnosis: L Pneumothorax  Post-operative Diagnosis: L Pneumothorax  Procedure Details  Informed consent was obtained for the procedure, including sedation.  Risks of lung perforation, hemorrhage, arrhythmia, and adverse drug reaction were discussed.   After sterile skin prep, using standard technique, a 16 French tube was placed in the left lateral 9th rib space.  Initial tube coiled in posterior diaphragmatic gutter so another tube was placed more apically.  Findings: 400 ml of serosanguinous fluid obtained  Estimated Blood Loss:  less than 100 mL         Specimens:  None              Complications:  None; patient tolerated the procedure well.         Disposition: ICU - intubated and critically ill.         Condition: unstable  Attending Attestation: I was present and scrubbed for the entire procedure.

## 2018-10-27 NOTE — ED Notes (Signed)
Spoke with mother and sister in law on phone [phone]. Will update with results.

## 2018-10-27 NOTE — Progress Notes (Signed)
Critical VBG results hand delivered to ED MD Dr Sherry Ruffing

## 2018-10-27 NOTE — ED Notes (Signed)
PAGED PCCM TO DR. Sherry Ruffing

## 2018-10-27 NOTE — Progress Notes (Signed)
Received pt from EMS being manually ventilated through ETT post CPR arrest. Pt placed on ventilator with no complications. ABG attempted x 3 with additional RT attempting x 2 with no success. ED MD made aware.

## 2018-10-27 NOTE — ED Provider Notes (Signed)
[facility] EMERGENCY DEPARTMENT Provider Note   CSN: [id number] Arrival date & time: 10/10/18  1002     History   Chief Complaint Chief Complaint  Patient presents with   Post CPR    HPI [name] is a 58 y.o. male.     LVL 5 caveat for intubated   The history is provided by the EMS personnel and medical records. No language interpreter was used.  Cardiac Arrest Witnessed by:  Healthcare provider Incident location:  Home Time before ALS initiated:  Immediate Condition upon EMS arrival:  Agonal respirations Pulse:  Absent Treatments prior to arrival:  ACLS protocol Medications given prior to ED:  Epinephrine Airway:  Intubation prior to arrival   Past Medical History:  Diagnosis Date   Alcohol abuse    Tobacco use disorder     Patient Active Problem List   Diagnosis Date Noted   Acute pancreatitis 07/07/2016   Tobacco use disorder 07/07/2016   Alcohol abuse 07/07/2016    History reviewed. No pertinent surgical history.      Home Medications    Prior to Admission medications   Medication Sig Start Date End Date Taking? Authorizing Provider  aspirin 325 MG tablet Take 325 mg by mouth every 6 (six) hours as needed for mild pain.    [provider]  morphine (MSIR) 15 MG tablet Take 1 tablet (15 mg total) by mouth every 4 (four) hours as needed for severe pain. 09/28/18   Deno Etienne, DO  ondansetron (ZOFRAN ODT) 4 MG disintegrating tablet Take 1 tablet (4 mg total) by mouth every 8 (eight) hours as needed for nausea or vomiting. 09/28/18   Deno Etienne, DO  traMADol (ULTRAM) 50 MG tablet Take 1 tablet (50 mg total) by mouth every 6 (six) hours as needed for moderate pain or severe pain. Patient not taking: Reported on 09/28/2018 06/09/17   Domenic Moras, PA-C    Family History Family History  Problem Relation Age of Onset   Hypertension Mother     Social History Social History   Tobacco Use   Smoking status: Current  Every Day Smoker   Smokeless tobacco: Never Used  Substance Use Topics   Alcohol use: Yes    Comment: heavy; 6 pack/day   Drug use: No     Allergies   Patient has no known allergies.   Review of Systems Review of Systems  Unable to perform ROS: Intubated     Physical Exam Updated Vital Signs BP (!) 119/97    Pulse 98    Resp (!) 30    Ht _0  (1.676 m)    Wt 65.8 kg    SpO2 98%    BMI 23.41 kg/m   Physical Exam Vitals signs and nursing note reviewed.  Constitutional:      General: He is in acute distress.     Appearance: He is well-developed. He is ill-appearing.     Interventions: He is intubated.  HENT:     Head: Normocephalic and atraumatic.     Nose: No congestion.     Mouth/Throat:     Pharynx: No posterior oropharyngeal erythema.  Eyes:     Conjunctiva/sclera: Conjunctivae normal.     Pupils: Pupils are equal, round, and reactive to light.  Neck:     Musculoskeletal: Neck supple.  Cardiovascular:     Rate and Rhythm: Normal rate and regular rhythm.  Pulmonary:     Effort: Pulmonary effort is normal. No respiratory distress.  He is intubated.     Breath sounds: Normal breath sounds.  Abdominal:     General: Bowel sounds are normal. There is distension (on reassement).     Tenderness: There is no abdominal tenderness.    Genitourinary:    Penis: Normal.   Skin:    General: Skin is cool.     Capillary Refill: Capillary refill takes 2 to 3 seconds.     Coloration: Skin is cyanotic and mottled.  Neurological:     Mental Status: He is unresponsive.     GCS: GCS eye subscore is 4. GCS verbal subscore is 1. GCS motor subscore is 2.      ED Treatments / Results  Labs (all labs ordered are listed, but only abnormal results are displayed) Labs Reviewed  COMPREHENSIVE METABOLIC PANEL - Abnormal; Notable for the following components:      Result Value   Sodium 128 (*)    Potassium 6.5 (*)    Chloride 93 (*)    CO2 13 (*)    Glucose, Bld 502 (*)     BUN 59 (*)    Creatinine, Ser 4.74 (*)    Calcium 6.9 (*)    Total Protein 3.9 (*)    Albumin 1.5 (*)    AST 360 (*)    ALT 109 (*)    Total Bilirubin 1.8 (*)    GFR calc non Af Amer 13 (*)    GFR calc Af Amer 15 (*)    Anion gap 22 (*)    All other components within normal limits  LACTIC ACID, PLASMA - Abnormal; Notable for the following components:   Lactic Acid, Venous >11.0 (*)    All other components within normal limits  CBC WITH DIFFERENTIAL/PLATELET - Abnormal; Notable for the following components:   WBC 13.5 (*)    RBC 3.72 (*)    MCV 110.5 (*)    MCH 35.5 (*)    RDW 15.7 (*)    Platelets 91 (*)    nRBC 2.4 (*)    Neutro Abs 10.9 (*)    Abs Immature Granulocytes 0.71 (*)    All other components within normal limits  PROTIME-INR - Abnormal; Notable for the following components:   Prothrombin Time 21.7 (*)    INR 1.9 (*)    All other components within normal limits  TROPONIN I (HIGH SENSITIVITY) - Abnormal; Notable for the following components:   Troponin I (High Sensitivity) 63 (*)    All other components within normal limits  CK - Abnormal; Notable for the following components:   Total CK 709 (*)    All other components within normal limits  CBC - Abnormal; Notable for the following components:   MCV 105.5 (*)    MCH 35.3 (*)    RDW 15.7 (*)    Platelets 88 (*)    nRBC 3.8 (*)    All other components within normal limits  PROTIME-INR - Abnormal; Notable for the following components:   Prothrombin Time 21.5 (*)    INR 1.9 (*)    All other components within normal limits  CBG MONITORING, ED - Abnormal; Notable for the following components:   Glucose-Capillary 22 (*)    All other components within normal limits  I-STAT CHEM 8, ED - Abnormal; Notable for the following components:   Sodium 125 (*)    Potassium 6.3 (*)    Chloride 95 (*)    BUN 61 (*)    Creatinine, Ser 4.20 (*)  Glucose, Bld 478 (*)    Calcium, Ion 0.90 (*)    TCO2 17 (*)    All other  components within normal limits  CBG MONITORING, ED - Abnormal; Notable for the following components:   Glucose-Capillary 48 (*)    All other components within normal limits  CBG MONITORING, ED - Abnormal; Notable for the following components:   Glucose-Capillary 332 (*)    All other components within normal limits  CBG MONITORING, ED - Abnormal; Notable for the following components:   Glucose-Capillary 448 (*)    All other components within normal limits  POCT I-STAT EG7 - Abnormal; Notable for the following components:   pH, Ven 7.098 (*)    Bicarbonate 16.3 (*)    TCO2 18 (*)    Acid-base deficit 14.0 (*)    Sodium 126 (*)    Potassium 6.8 (*)    Calcium, Ion 1.01 (*)    All other components within normal limits  POCT I-STAT 7, (LYTES, BLD GAS, ICA,H+H) - Abnormal; Notable for the following components:   pH, Arterial 7.240 (*)    pO2, Arterial 438.0 (*)    Acid-base deficit 8.0 (*)    Sodium 132 (*)    All other components within normal limits  SARS CORONAVIRUS 2 (HOSPITAL ORDER, [facility])  CULTURE, BLOOD (ROUTINE X 2)  CULTURE, BLOOD (ROUTINE X 2)  URINE CULTURE  LACTIC ACID, PLASMA  URINALYSIS, ROUTINE W REFLEX MICROSCOPIC  RAPID URINE DRUG SCREEN, HOSP PERFORMED  TSH  PATHOLOGIST SMEAR REVIEW  HIV ANTIBODY (ROUTINE TESTING W REFLEX)  CBC  CBC  LIPASE, BLOOD  AMYLASE  BASIC METABOLIC PANEL  BLOOD GAS, ARTERIAL  TYPE AND SCREEN    EKG EKG Interpretation  Date/Time:  Oct 24, 2018 10:05:24 EDT Ventricular Rate:  89 PR Interval:    QRS Duration: 101 QT Interval:  414 QTC Calculation: 504 R Axis:   85 Text Interpretation:  Sinus rhythm Minimal ST elevation, anterior leads Prolonged QT interval when compard to prior, faster rate and slightly longer qTc.  no stemi Confirmed by Antony Blackbird [id number]) on 10-24-18 10:20:08 AM   Radiology Ct Abdomen Pelvis Wo Contrast  Result Date: 2018-10-24 CLINICAL DATA:  Cardiac arrest.  Rigid abdomen. Recent diagnosis of pancreatitis. EXAM: CT CHEST, ABDOMEN AND PELVIS WITHOUT CONTRAST TECHNIQUE: Multidetector CT imaging of the chest, abdomen and pelvis was performed following the standard protocol without IV contrast. COMPARISON:  09/28/2018 CT abdomen/pelvis. Chest radiograph from earlier today. FINDINGS: CT CHEST FINDINGS Cardiovascular: Normal heart size. No significant pericardial fluid/thickening. Right internal jugular central venous catheter terminates in the lower third of the SVC. Atherosclerotic nonaneurysmal thoracic aorta. Normal caliber pulmonary arteries. Mediastinum/Nodes: No pneumomediastinum. No mediastinal hematoma. No discrete thyroid nodules. Enteric tube terminates in the distal stomach. Otherwise unremarkable esophagus. No axillary, mediastinal or hilar lymphadenopathy. Lungs/Pleura: Small left hydropneumothorax, approximately 5-10%. Anterior left chest tube in place. No right pneumothorax. Small dependent right pleural effusion. Endotracheal tube tip is 2.5 cm above the carina. Moderate centrilobular emphysema. No central airway stenosis. Mild passive atelectasis at the dependent lung bases bilaterally. Solid left upper lobe 5 mm pulmonary nodule (series 5/image 57). Patchy ground-glass centrilobular micronodularity in both lungs, probably smoking related. Musculoskeletal: No aggressive appearing focal osseous lesions. Nondisplaced bilateral anterior first through sixth rib fractures. Mild thoracic spondylosis. Extensive subcutaneous emphysema throughout the left chest wall. CT ABDOMEN PELVIS FINDINGS Hepatobiliary: Diffuse hepatic steatosis. No definite liver surface irregularity. No liver mass. Vicarious excretion of  IV contrast in the gallbladder, which otherwise appears normal. No biliary ductal dilatation. Pancreas: Prominent diffuse pancreatic thickening and peripancreatic edema compatible with acute pancreatitis, worsened in the interval. No pancreatic mass. No  measurable peripancreatic collection. No pancreatic duct dilation. Spleen: Normal size. No mass. Adrenals/Urinary Tract: Normal adrenals. Persistent bilateral patchy contrast nephrograms on this noncontrast scan. No hydronephrosis. No contour deforming renal masses. Bladder collapsed by indwelling Foley catheter and not well evaluated. Stomach/Bowel: Normal non-distended stomach. Normal caliber small bowel with no small bowel wall thickening. Normal appendix. Moderate sigmoid diverticulosis, with no definite large bowel wall thickening. Vascular/Lymphatic: Atherosclerotic nonaneurysmal abdominal aorta. Left common femoral artery approach central arterial catheter terminates in the left external iliac artery. No pathologically enlarged lymph nodes in the abdomen or pelvis. Reproductive: Top-normal size prostate. Other: No pneumoperitoneum, ascites or focal fluid collection. Extensive subcutaneous emphysema throughout the bilateral ventral abdominal wall. Musculoskeletal: No aggressive appearing focal osseous lesions. Marked lumbar spondylosis. IMPRESSION: 1. Bilateral anterior first through sixth acute rib fractures. Small left hydropneumothorax with left chest tube in place. Extensive subcutaneous emphysema throughout the left chest wall and bilateral ventral abdominal wall. 2. Small dependent right pleural effusion. 3. Well-positioned endotracheal tube, enteric tube, right internal jugular central venous catheter and left common femoral artery approach arterial catheter. 4. Left upper lobe 5 mm solid pulmonary nodule. No follow-up needed if patient is low-risk. Non-contrast chest CT can be considered in 12 months if patient is high-risk. This recommendation follows the consensus statement: Guidelines for Management of Incidental Pulmonary Nodules Detected on CT Images:From the Fleischner Society 2017; published online before print (10.1148/[id number]). 5. Acute pancreatitis, worsened since 09/28/2018 CT. 6.  Persistent bilateral patchy contrast nephrograms on this noncontrast scan, indicative of acute renal failure. 7. Diffuse hepatic steatosis. 8. Moderate sigmoid diverticulosis. 9. Aortic Atherosclerosis (ICD10-I70.0) and Emphysema (ICD10-J43.9). Electronically Signed   By: Ilona Sorrel M.D.   On: 10/27/2018 15:36   Ct Head Wo Contrast  Result Date: 10/27/18 CLINICAL DATA:  Patient status post cardiac arrest.  Region abdomen. EXAM: CT HEAD WITHOUT CONTRAST TECHNIQUE: Contiguous axial images were obtained from the base of the skull through the vertex without intravenous contrast. COMPARISON:  None. FINDINGS: Brain: Ventricles and sulci are appropriate for patient's age. No evidence for acute cortically based infarct, intracranial hemorrhage, mass lesion or mass-effect. Periventricular and subcortical white matter hypodensities compatible with chronic microvascular ischemic changes. Vascular: Unremarkable Skull: Intact. Sinuses/Orbits: Air-fluid levels within the maxillary and sphenoid sinuses. Mucosal thickening frontal sinus and ethmoid air cells. Mastoid air cells are unremarkable. Orbits are unremarkable. Other: Patient is intubated. IMPRESSION: No acute intracranial process. Electronically Signed   By: Lovey Newcomer M.D.   On: 10-27-18 15:16   Ct Chest Wo Contrast  Result Date: Oct 27, 2018 CLINICAL DATA:  Cardiac arrest. Rigid abdomen. Recent diagnosis of pancreatitis. EXAM: CT CHEST, ABDOMEN AND PELVIS WITHOUT CONTRAST TECHNIQUE: Multidetector CT imaging of the chest, abdomen and pelvis was performed following the standard protocol without IV contrast. COMPARISON:  09/28/2018 CT abdomen/pelvis. Chest radiograph from earlier today. FINDINGS: CT CHEST FINDINGS Cardiovascular: Normal heart size. No significant pericardial fluid/thickening. Right internal jugular central venous catheter terminates in the lower third of the SVC. Atherosclerotic nonaneurysmal thoracic aorta. Normal caliber pulmonary arteries.  Mediastinum/Nodes: No pneumomediastinum. No mediastinal hematoma. No discrete thyroid nodules. Enteric tube terminates in the distal stomach. Otherwise unremarkable esophagus. No axillary, mediastinal or hilar lymphadenopathy. Lungs/Pleura: Small left hydropneumothorax, approximately 5-10%. Anterior left chest tube in place. No right pneumothorax. Small dependent right pleural  effusion. Endotracheal tube tip is 2.5 cm above the carina. Moderate centrilobular emphysema. No central airway stenosis. Mild passive atelectasis at the dependent lung bases bilaterally. Solid left upper lobe 5 mm pulmonary nodule (series 5/image 57). Patchy ground-glass centrilobular micronodularity in both lungs, probably smoking related. Musculoskeletal: No aggressive appearing focal osseous lesions. Nondisplaced bilateral anterior first through sixth rib fractures. Mild thoracic spondylosis. Extensive subcutaneous emphysema throughout the left chest wall. CT ABDOMEN PELVIS FINDINGS Hepatobiliary: Diffuse hepatic steatosis. No definite liver surface irregularity. No liver mass. Vicarious excretion of IV contrast in the gallbladder, which otherwise appears normal. No biliary ductal dilatation. Pancreas: Prominent diffuse pancreatic thickening and peripancreatic edema compatible with acute pancreatitis, worsened in the interval. No pancreatic mass. No measurable peripancreatic collection. No pancreatic duct dilation. Spleen: Normal size. No mass. Adrenals/Urinary Tract: Normal adrenals. Persistent bilateral patchy contrast nephrograms on this noncontrast scan. No hydronephrosis. No contour deforming renal masses. Bladder collapsed by indwelling Foley catheter and not well evaluated. Stomach/Bowel: Normal non-distended stomach. Normal caliber small bowel with no small bowel wall thickening. Normal appendix. Moderate sigmoid diverticulosis, with no definite large bowel wall thickening. Vascular/Lymphatic: Atherosclerotic nonaneurysmal  abdominal aorta. Left common femoral artery approach central arterial catheter terminates in the left external iliac artery. No pathologically enlarged lymph nodes in the abdomen or pelvis. Reproductive: Top-normal size prostate. Other: No pneumoperitoneum, ascites or focal fluid collection. Extensive subcutaneous emphysema throughout the bilateral ventral abdominal wall. Musculoskeletal: No aggressive appearing focal osseous lesions. Marked lumbar spondylosis. IMPRESSION: 1. Bilateral anterior first through sixth acute rib fractures. Small left hydropneumothorax with left chest tube in place. Extensive subcutaneous emphysema throughout the left chest wall and bilateral ventral abdominal wall. 2. Small dependent right pleural effusion. 3. Well-positioned endotracheal tube, enteric tube, right internal jugular central venous catheter and left common femoral artery approach arterial catheter. 4. Left upper lobe 5 mm solid pulmonary nodule. No follow-up needed if patient is low-risk. Non-contrast chest CT can be considered in 12 months if patient is high-risk. This recommendation follows the consensus statement: Guidelines for Management of Incidental Pulmonary Nodules Detected on CT Images:From the Fleischner Society 2017; published online before print (10.1148/[id number]). 5. Acute pancreatitis, worsened since 09/28/2018 CT. 6. Persistent bilateral patchy contrast nephrograms on this noncontrast scan, indicative of acute renal failure. 7. Diffuse hepatic steatosis. 8. Moderate sigmoid diverticulosis. 9. Aortic Atherosclerosis (ICD10-I70.0) and Emphysema (ICD10-J43.9). Electronically Signed   By: Ilona Sorrel M.D.   On: 10/19/18 15:36   Dg Chest Port 1 View  Result Date: 10-19-2018 CLINICAL DATA:  Follow-up pneumothorax EXAM: PORTABLE CHEST 1 VIEW COMPARISON:  Earlier same day FINDINGS: Endotracheal tube tip is 4 cm above the carina. Nasogastric tube enters the stomach. Right internal jugular central line  tip is in the SVC above the right atrium. Left chest tube is repositioned in there is resolution of the left pneumothorax. There is mild atelectasis in the left lower lobe. IMPRESSION: Reposition left chest tube. Resolution of left pneumothorax. Mild atelectasis left lower lobe. Electronically Signed   By: Nelson Chimes M.D.   On: Oct 19, 2018 12:56   Dg Chest Portable 1 View  Result Date: 2018-10-19 CLINICAL DATA:  Follow-up left pneumothorax status post chest tube placement EXAM: PORTABLE CHEST 1 VIEW COMPARISON:  Chest radiograph from earlier today. FINDINGS: Basilar left chest tube terminates over left hemidiaphragm. Endotracheal tube tip is 3.8 cm above the carina. Enteric tube enters stomach with the tip not seen on this image. Right internal jugular central venous catheter terminates over the cavoatrial junction.  Stable cardiomediastinal silhouette with normal heart size. Large left tension pneumothorax is unchanged with stable right mediastinal shift. No right pneumothorax. No pleural effusion. Complete left lung atelectasis. Clear right lung. IMPRESSION: 1. Persistent large left tension pneumothorax with right mediastinal shift, unchanged status post left basilar chest tube placement. 2. Stable complete left lung atelectasis. 3. Well-positioned endotracheal and enteric tubes. Critical Value/emergent results were called by telephone at the time of interpretation on 10/09/2018 at 12:34 pm to Dr. Marda Stalker , who verbally acknowledged these results. Electronically Signed   By: Ilona Sorrel M.D.   On: 2018/10/09 12:35   Dg Chest Portable 1 View  Result Date: 10/09/2018 CLINICAL DATA:  Status post CPR. EXAM: PORTABLE CHEST 1 VIEW COMPARISON:  Radiograph of July 07, 2016. FINDINGS: There is interval development of large left pneumothorax with complete atelectasis of the left lung. Endotracheal tube is in good position. Nasogastric tube is seen entering the stomach. Right internal jugular catheter is  noted with tip in expected position of SVC. Right lung is clear. Bony thorax is unremarkable. IMPRESSION: Endotracheal and nasogastric tubes are in grossly good position. Large left pneumothorax is noted with complete atelectasis of the left lung. Critical Value/emergent results were called by telephone at the time of interpretation on October 09, 2018 at 11:46 am to Dr. Marda Stalker , who verbally acknowledged these results. Electronically Signed   By: Marijo Conception M.D.   On: 2018/10/09 11:48    Procedures .Central Line  Date/Time: 09-Oct-2018 4:09 PM Performed by: Courtney Paris, MD Authorized by: Courtney Paris, MD   Consent:    Consent obtained:  Emergent situation Pre-procedure details:    Hand hygiene: Hand hygiene performed prior to insertion     Sterile barrier technique: All elements of maximal sterile technique followed     Skin preparation:  ChloraPrep   Skin preparation agent: Skin preparation agent completely dried prior to procedure   Anesthesia (see MAR for exact dosages):    Anesthesia method:  None Procedure details:    Location:  R internal jugular   Patient position:  Flat   Procedural supplies:  Triple lumen   Landmarks identified: yes     Ultrasound guidance: yes     Sterile ultrasound techniques: Sterile gel and sterile probe covers were used     Number of attempts:  1   Successful placement: yes   Post-procedure details:    Post-procedure:  Dressing applied and line sutured   Assessment:  Blood return through all ports, free fluid flow and placement verified by x-ray   Patient tolerance of procedure:  Tolerated well, no immediate complications Comments:     Successful placement of right IJ triple-lumen catheter.  Post placement x-ray revealed left-sided pneumothorax unrelated to central line.   (including critical care time)  CRITICAL CARE Performed by: Gwenyth Allegra Cyril Woodmansee Total critical care time: 60 minutes Critical care time was  exclusive of separately billable procedures and treating other patients. Critical care was necessary to treat or prevent imminent or life-threatening deterioration. Critical care was time spent personally by me on the following activities: development of treatment plan with patient and/or surrogate as well as nursing, discussions with consultants, evaluation of patient's response to treatment, examination of patient, obtaining history from patient or surrogate, ordering and performing treatments and interventions, ordering and review of laboratory studies, ordering and review of radiographic studies, pulse oximetry and re-evaluation of patient's condition.  Avrum Kimball was evaluated in Emergency Department on 10-09-2018 for the symptoms  described in the history of present illness. He was evaluated in the context of the global COVID-19 pandemic, which necessitated consideration that the patient might be at risk for infection with the SARS-CoV-2 virus that causes COVID-19. Institutional protocols and algorithms that pertain to the evaluation of patients at risk for COVID-19 are in a state of rapid change based on information released by regulatory bodies including the CDC and federal and state organizations. These policies and algorithms were followed during the patient's care in the ED.   Medications Ordered in ED Medications  ipratropium-albuterol (DUONEB) 0.5-2.5 (3) MG/3ML nebulizer solution 3 mL (3 mLs Nebulization Not Given 10-18-2018 1230)  chlorhexidine gluconate (MEDLINE KIT) (PERIDEX) 0.12 % solution 15 mL (has no administration in time range)  MEDLINE mouth rinse (has no administration in time range)  midazolam (VERSED) injection 2 mg (has no administration in time range)  midazolam (VERSED) injection 1 mg (has no administration in time range)  sodium bicarbonate 150 mEq in sterile water 1,000 mL infusion ( Intravenous New Bag/Given 2018-10-18 1327)  0.9 %  sodium chloride infusion (has no  administration in time range)  acetaminophen (TYLENOL) tablet 650 mg (has no administration in time range)  ondansetron (ZOFRAN) injection 4 mg (has no administration in time range)  famotidine (PEPCID) IVPB 20 mg premix (20 mg Intravenous New Bag/Given 10-18-2018 1330)  0.9 %  sodium chloride infusion (has no administration in time range)  norepinephrine (LEVOPHED) 69m in 2516mpremix infusion (has no administration in time range)  vasopressin (PITRESSIN) 40 Units in sodium chloride 0.9 % 250 mL (0.16 Units/mL) infusion (has no administration in time range)  hydrocortisone sodium succinate (SOLU-CORTEF) 100 MG injection 50 mg (has no administration in time range)  rocuronium (ZEMURON) injection (50 mg Intravenous Given 10/01/21/20315)  sodium bicarbonate injection (100 mEq Intravenous Given 09/2018-07-23320)  calcium chloride injection (1 g Intravenous Given 7/23-Jul-2020322)  heparin injection 5,000 Units (has no administration in time range)  dexmedetomidine (PRECEDEX) 200 MCG/50ML (4 mcg/mL) infusion (has no administration in time range)  thiamine (B-1) injection 100 mg (has no administration in time range)  folic acid injection 1 mg (has no administration in time range)  multivitamin liquid 15 mL (has no administration in time range)  fentaNYL (SUBLIMAZE) injection 25-100 mcg (has no administration in time range)  sodium zirconium cyclosilicate (LOKELMA) packet 10 g (has no administration in time range)  sodium chloride 0.9 % bolus 1,000 mL (has no administration in time range)  fentaNYL 25002min NS [id number]) infusion-PREMIX (has no administration in time range)  dextrose 50 % solution (  Given 7/5/207-23-2020)  sodium chloride flush (NS) 0.9 % injection 3 mL (3 mLs Intravenous Given 7/5/207-23-2020)  dextrose 50 % solution 25 g (25 g Intravenous Given 7/5/207-23-2020)  albuterol (PROVENTIL) (2.5 MG/3ML) 0.083% nebulizer solution 10 mg (10 mg Nebulization Given 7/5/207/23/20)  calcium gluconate 1 g/ 50 mL  sodium chloride IVPB (0 g Intravenous Stopped 7/5/207/23/20)     Initial Impression / Assessment and Plan / ED Course  I have reviewed the triage vital signs and the nursing notes.  Pertinent labs & imaging results that were available during my care of the patient were reviewed by me and considered in my medical decision making (see chart for details).        Daniel Abbott 58 y.11 male with a past medical history significant for recent pancreatitis who presents post CPR.  Patient brought in by EMS after  patient was found to have respiratory arrest and cardiac arrest.  Patient was intubated with EMS and had CPR for approximately 10 minutes with EMS.  Patient was found to be hypoglycemic with glucose in the 30s.  He was given D50 and Narcan.  He was also given D50 and started on a D10 drip with an IO.  Patient's glucose was not rechecked prior to arrival.  Unclear rhythm that the patient was in during CPR.    Upon arrival, airway appeared to be intact with the field intubation.  Breath sounds were appreciated on both sides initially.  Blood pressure was slightly hypertensive upon arrival.  On exam, patient had rhonchi in both lungs abdomen and chest did not appear to be tender.  Patient was intubated and was having some posturing.  Patient was given several boluses of D50 with improvement in his glucose.  Patient started to move around more with the improvement in glucose.  Patient started on propofol for his movement with intubation.  Blood was unable to be collected due to poor perfusion distally.  Decision was made to place central line both for central access for medications as well as blood.  Central line placed without significant difficulty in the right internal jugular.  Blood was then collected with this.  X-ray showed placement of the internal jugular but also revealed a large tension pneumothorax on the left, the side opposite from the central line.  Critical care was called before  internal jugular placement and came to the bedside for chest tube placement.  Critical care assumed care as patient was placed on 2 a Levophed drip for the hypotension.  Patient was started on medications to help with his hyperkalemia.  Critical care will manage the tension pneumothorax and will further manage the patient.  Patient admitted to ICU for further management.  It is unclear if the patient's pneumothorax was from CPR or contributed to his respiratory and cardiac arrest.  Patient admitted to the ICU.   Final Clinical Impressions(s) / ED Diagnoses   Final diagnoses:  Cardiac arrest ([place])  Hypoglycemia  Hypotension, unspecified hypotension type    ED Discharge Orders    None     Clinical Impression: 1. Cardiac arrest ([place])   2. Hypoglycemia   3. Hypotension, unspecified hypotension type   4. Pneumothorax     Disposition: Admit  This note was prepared with assistance of Systems analyst. Occasional wrong-word or sound-a-like substitutions may have occurred due to the inherent limitations of voice recognition software.     Vania Rosero, Gwenyth Allegra, MD 10-13-18 [phone]

## 2018-10-27 NOTE — H&P (Addendum)
NAME:  Daniel Abbott, MRN:  [id number], DOB:  06/06/1960, LOS: 0 ADMISSION DATE:  09/26/2018, CONSULTATION DATE:  10/14/2018 REFERRING MD:  Dr. Rush Landmarkegeler, CHIEF COMPLAINT:  Cardiac Arrest    Brief History   58 y/o M with recent diagnosis of pancreatitis (7/3) admitted 7/5 with AMS in the setting of hypoglycemia.  Suffered a cardiac arrest with EMS requiring ~ 10 minutes CPR before ROSC. Initial CXR in ER showed large left pneumothorax requiring chest tube placement.  PCCM called for ICU admit.   History of present illness   58 y/o M who [facility] on 7/5 via EMS with reports of altered mental status.  The patient was found to be hypoglycemic with CBG of 33.  EMS reported he was cyanotic on arrival with pinpoint pupils.  He was treated per EMS with D50w. Additionally, he was given Narcan.  The patient reportedly suffered a cardiac arrest after EMS arrival.  He required approximately 10 minutes of CPR before ROSC.  He received 5 epi for cardiac arrest.    The patient was seen in the ER on 7/3 for complaints of epigastric / chest pain with nausea & vomiting. During that visit, per documentation, he had SB with hyperacute T waves, lactate of ~ 4, lipase 200's, normal LFT's. CT of the abdomen was concerning for acute pancreatitis. No biliary abnormalities noted on labs.  He was discharged with anti-emetics and 15 mg MSIR Q4 PRN pain, ultram PRN.    ER evaluation on 7/5 notable for - Labs: Na 128, K 6.5, Cl 93, glucose 502, Co2 13, BUN 59, Sr Cr 4.74, AG 22, albumin 1.5, AST 360, ALT 109, t-bili 1.8, CK 709, Troponin 63, LA >11, WBC 13.5, Hgb 13.2 and platelets 91.  PCXR demonstrated a large left pneumothorax. Initial left chest tube placed and malpositioned, removed.  Subsequent chest tube placed with resolution of pneumothorax. Patient was hemodynamically unstable in ER requiring vasopressors.  Central access placed per EDP. Arterial line placed in ER per PCCM.  CT abd consistent with acute pancreatitis.      PCCM called for ICU admit.   Mother reports he wanted to go outside and smoke.  He had to have help to get to the porch by his brother.  After his brother lit he sat down to smoke and "took one puff and fell over".   His sister-in-law noted that his feet were purple & this prompted them to call EMS.  They are unsure if he was breathing when he fell over but he had urinary incontinence while outside (per brother). EMS was there within approximately two minutes.  Mother reports he had red blood in his stool since his last ER visit on 7/3.  No ETOH since Friday 7/3 but Mom reports he is a "heavy drinker".   Past Medical History  ETOH Abuse Tobacco Abuse    Significant Hospital Events   7/03 ER visit for suspected pancreatitis   Consults:  PCCM   Procedures:  ETT 7/5 >>  L Chest Tube 7/5 >>  L IJ TLC 7/5 >>   Significant Diagnostic Tests:  CT ABD/Pelvis 7/5 >> consistent with acute pancreatitis, no evidence of pancreatic pseudocyst or pancreatic necrosis, fatty infiltration of liver CT Head 7/5 >>  CT Chest 7/5 >>   Micro Data:  COVID 7/5 >> negative  BCx2 7/5 >>  UC 7/5 >>   Antimicrobials:    Interim history/subjective:  As above  Objective   Blood pressure (!) 159/98,  pulse 82, temperature (!) 95.1 F (35.1 C), resp. rate (!) 28, height 5\' 6"  (1.676 m), weight 65.8 kg, SpO2 95 %.    Vent Mode: PRVC FiO2 (%):  [100 %] 100 % Set Rate:  [20 bmp] 20 bmp Vt Set:  [500 mL-510 mL] 510 mL PEEP:  [5 cmH20-8 cmH20] 8 cmH20   Intake/Output Summary (Last 24 hours) at Oct 14, 2018 1217 Last data filed at 2018/10/14 1028 Gross per 24 hour  Intake 500 ml  Output 0 ml  Net 500 ml   Filed Weights   10-14-18 1031  Weight: 65.8 kg    Examination: General:  Thin adult male lying in bed on vent, critically ill appearing  HEENT: MM pink/moist, ETT, L IJ TLC  Neuro: altered on vent, no response to verbal / painful stimuli  CV: s1s2 rrr, no m/r/g PULM: labored on vent, decreased  breath sounds initially on left.  Bilateral equal breath sound post CT placement  MP:NTIR, non-tender, bsx4 active  Extremities: cool / dry, no edema  Skin: no rashes or lesions  Resolved Hospital Problem list      Assessment & Plan:   Cardiac Arrest with Shock  -initial call for AMS & found to have profound hypoglycemia (30's), found to have tension pneumothorax on arrival.  Suspect element of SIRS / Sepsis with pancreatitis, volume depletion, hypoglycemia + narcotics leading to initial presentation -required ~10 minutes CPR P: ICU admit  Goal normothermia / avoid fever Levophed, vasopressin for MAP >65  Stress dose steroids  Supportive care for pancreatitis  Aline monitoring for BP  Assess repeat EKG now  Trend Troponin   Acute Pancreatitis  -no evidence of pseudocyst or necrosis on CT  P: NS at 100 ml/hr + bicarbonate for now  Assess Lipase / Amylase NPO for now, consider trickle feeding in am with no increase with pancreatitis  No role for abx at this time  Follow fever curve / WBC trend   Left Pneumothorax  -initial CT malpositioned, replaced with resolution of PTX P: CT in place, 20 cm suction Follow serial CXR while on vent  / CT Chest tube care per protocol  Assess CT chest w/o contrast   Acute Hypoxic Respiratory Failure  Tobacco Abuse  P: PRVC 8cc/kg, rate 28 PEEP / FiO2 for sats 88-94% Follow serial CXR  Daily SBT / WUA when appropriate   Acute Kidney Injury  -in setting of pancreatitis / SIRS, hypotension / cardiac arrest  AGMA / Lactic Acidosis  Hyperkalemia  Hyponatremia  P: Treat for hyperkalemia with insulin, dextrose, bicarbonate, calcium  NS at 100 ml/hr  Trend BMP / urinary output Replace electrolytes as indicated Avoid nephrotoxic agents, ensure adequate renal perfusion Assess ABG now  Trend lactate   Profound Hypoglycemia  P: CBG Q4 until glucose consistently > 100  Thrombocytopenia  P: Follow Q6 CBC   Heavy ETOH Abuse  -last  ETOH 7/3, may be component withdrawal Acute Metabolic Encephalopathy  -possible hypoxic injury post arrest, hypoglycemic event, AKI / metabolic disturbances P: Precedex for sedation / possible element of withdrawal  Serial neuro exams  Await CT head  Minimize sedation as able  RASS Goal: 0 to -1    Best practice:  Diet: NPO Pain/Anxiety/Delirium protocol (if indicated): precedex, PRN versed / fentanyl  VAP protocol (if indicated): in place  DVT prophylaxis: SCD's + heparin to start in am 7/6 GI prophylaxis: Pepcid  Glucose control: CBG Q4 Mobility: Bedrest  Code Status: Full Code  Family Communication: Mother, brother  called for update 7/5. Disposition: ICU   Labs   CBC: Recent Labs  Lab 09/28/18 1056 10/31/18 1111 10/31/18 1135 10/31/18 1142  WBC 15.0* 13.5*  --   --   NEUTROABS 12.9* 10.9*  --   --   HGB 16.4 13.2 15.3 13.9  HCT 48.4 41.1 45.0 41.0  MCV 103.4* 110.5*  --   --   PLT 192 91*  --   --     Basic Metabolic Panel: Recent Labs  Lab 09/28/18 0950 10/31/18 1111 10/31/18 1135 10/31/18 1142  NA 140 128* 126* 125*  K 3.0* 6.5* 6.8* 6.3*  CL 98 93*  --  95*  CO2 29 13*  --   --   GLUCOSE 176* 502*  --  478*  BUN 7 59*  --  61*  CREATININE 1.10 4.74*  --  4.20*  CALCIUM 9.4 6.9*  --   --    GFR: Estimated Creatinine Clearance: 17.5 mL/min (A) (by C-G formula based on SCr of 4.2 mg/dL (H)). Recent Labs  Lab 09/28/18 0950 09/28/18 1056 09/28/18 1139 10/31/18 1111  WBC  --  15.0*  --  13.5*  LATICACIDVEN 3.9*  --  2.2* >11.0*    Liver Function Tests: Recent Labs  Lab 09/28/18 0950 10/31/18 1111  AST 38 360*  ALT 19 PENDING  ALKPHOS 68 66  BILITOT 1.2 1.8*  PROT 6.4* 3.9*  ALBUMIN 3.1* 1.5*   Recent Labs  Lab 09/28/18 0950  LIPASE 255*   No results for input(s): AMMONIA in the last 168 hours.  ABG    Component Value Date/Time   HCO3 16.3 (L) 10/31/2018 1135   TCO2 17 (L) 10/31/2018 1142   ACIDBASEDEF 14.0 (H) 10/31/2018 1135     O2SAT 61.0 10/31/2018 1135     Coagulation Profile: Recent Labs  Lab 10/31/18 1111  INR 1.9*    Cardiac Enzymes: Recent Labs  Lab 10/31/18 1111  CKTOTAL 709*    HbA1C: No results found for: HGBA1C  CBG: Recent Labs  Lab 10/31/18 1003 10/31/18 1009 10/31/18 1018 10/31/18 1119  GLUCAP 22* 48* 332* 448*    Review of Systems:   Unable to complete as patient is altered on mechanical ventilation.   Past Medical History  He,  has a past medical history of Alcohol abuse and Tobacco use disorder.   Surgical History   History reviewed. No pertinent surgical history.   Social History   reports that he has been smoking. He has never used smokeless tobacco. He reports current alcohol use. He reports that he does not use drugs.   Family History   His family history includes Hypertension in his mother.   Allergies No Known Allergies   Home Medications  Prior to Admission medications   Medication Sig Start Date End Date Taking? Authorizing Provider  aspirin 325 MG tablet Take 325 mg by mouth every 6 (six) hours as needed for mild pain.   Yes [provider]  morphine (MSIR) 15 MG tablet Take 1 tablet (15 mg total) by mouth every 4 (four) hours as needed for severe pain. 09/28/18  Yes Melene PlanFloyd, Dan, DO  ondansetron (ZOFRAN ODT) 4 MG disintegrating tablet Take 1 tablet (4 mg total) by mouth every 8 (eight) hours as needed for nausea or vomiting. 09/28/18  Yes Melene PlanFloyd, Dan, DO  traMADol (ULTRAM) 50 MG tablet Take 1 tablet (50 mg total) by mouth every 6 (six) hours as needed for moderate pain or severe pain. Patient not taking: Reported  on 09/28/2018 06/09/17   Fayrene Helperran, [place], PA-C     Critical care time: 40 minutes     Canary BrimBrandi Dorrene Bently, NP-C [facility] Pgr: [phone] or if no answer [phone] 10/18/2018, 12:17 PM

## 2018-10-27 NOTE — Progress Notes (Signed)
[facility] Progress Note Patient Name: Daniel Abbott DOB: 10-07-1960 MRN: [id number]   Date of Service  2018-10-15  HPI/Events of Note  Hypotension - BP = 66/23. Currently on Norepinephrine IV infusion at ceiling and Vasopressin IV infusion at "shock dose". Last pH = 7.168. Patient is a DNR.  eICU Interventions  Will order: 1. NaHCO3 200 meq IV now.      Intervention Category Major Interventions: Hypotension - evaluation and management  Sommer,Steven Eugene Oct 15, 2018, 9:50 PM

## 2018-10-27 NOTE — Progress Notes (Signed)
Spoke to mother, father, brother in person.  Discussed multiorgan failure poor prognosis.  We agreed to try CRRT but if his heart stops again we will allow him to pass away in peace without further chest compressions or shocks.

## 2018-10-27 NOTE — ED Notes (Signed)
ED TO INPATIENT HANDOFF REPORT  ED Nurse Name and Phone #: Nicki Reaper [place] Name/Age/Gender Daniel Abbott 58 y.o. male Room/Bed: RESUSC/RESUSC  Code Status   Code Status: Full Code  Home/SNF/Other   Is this baseline?   Triage Complete: Triage complete  Chief Complaint cpr  Triage Note Pt BIB [facility] for post CPR. EMS reports they pt was hypoglycemic at 33, ems administered D50 and narcan due to narcotic usage. EMS reports the pt went into cardiac arrest in their presence and had a period of only 10 minutes of CPR before ROSC. EMS reports pt was cyanotic on their arrival. EMS reports pt's pupils were pinpoint, fixed, and non-reactive. EMS reports the pt was recently diagnosed on Friday with pancreatitis. EMS reports a total of 5 epi's for cardiac arrest. EMS advised 12 lead unremarkable and no blood pressures while in route.    Allergies No Known Allergies  Level of Care/Admitting Diagnosis ED Disposition    ED Disposition Condition [facility] Area: [place] [[id number]]  Level of Care: ICU [6]  Covid Evaluation: Confirmed COVID Negative  Diagnosis: Cardiac arrest ([place]) [427.5.ICD-9-CM]  Admitting Physician: Candee Furbish [[id number]]  Attending Physician: Candee Furbish [[id number]]  Estimated length of stay: 3 - 4 days  Certification:: I certify this patient will need inpatient services for at least 2 midnights  PT Class (Do Not Modify): Inpatient [101]  PT Acc Code (Do Not Modify): Private [1]       B Medical/Surgery History Past Medical History:  Diagnosis Date  . Alcohol abuse   . Tobacco use disorder    History reviewed. No pertinent surgical history.   A IV Location/Drains/Wounds Patient Lines/Drains/Airways Status   Active Line/Drains/Airways    Name:   Placement date:   Placement time:   Site:   Days:   Peripheral IV 10/05/18 Anterior;Right Other (Comment)   10/05/2018    -    Other (Comment)   less than 1   Peripheral IV  05-Oct-2018 Left Antecubital   10-05-18    1008    Antecubital   less than 1   NG/OG Tube Orogastric 16 Fr. [place] mouth Xray;Aucultation   Oct 05, 2018    [place] mouth   less than 1   Urethral Catheter Rayford Halsted, RN Double-lumen;Temperature probe 14 Fr.   05-Oct-2018    1138    Double-lumen;Temperature probe   less than 1   Airway 8 mm   Oct 05, 2018    1003     less than 1          Intake/Output Last 24 hours  Intake/Output Summary (Last 24 hours) at 2018-10-05 1347 Last data filed at 10-05-2018 1028 Gross per 24 hour  Intake 500 ml  Output 0 ml  Net 500 ml    Labs/Imaging Results for orders placed or performed during the hospital encounter of 10-05-2018 (from the past 48 hour(s))  CBG monitoring, ED     Status: Abnormal   Collection Time: Oct 05, 2018 10:03 AM  Result Value Ref Range   Glucose-Capillary 22 (LL) 70 - 99 mg/dL   Comment 1 Document in Chart   CBG monitoring, ED     Status: Abnormal   Collection Time: 10/05/2018 10:09 AM  Result Value Ref Range   Glucose-Capillary 48 (L) 70 - 99 mg/dL  CBG monitoring, ED     Status: Abnormal   Collection Time: Oct 05, 2018 10:18 AM  Result Value Ref Range  Glucose-Capillary 332 (H) 70 - 99 mg/dL  Comprehensive metabolic panel     Status: Abnormal   Collection Time: 10-28-18 11:11 AM  Result Value Ref Range   Sodium 128 (L) 135 - 145 mmol/L   Potassium 6.5 (HH) 3.5 - 5.1 mmol/L    Comment: CRITICAL RESULT CALLED TO, READ BACK BY AND VERIFIED WITH: S Jeananne Bedwell,RN AT 1213 10-28-2018 BY L BENFIELD    Chloride 93 (L) 98 - 111 mmol/L   CO2 13 (L) 22 - 32 mmol/L   Glucose, Bld 502 (HH) 70 - 99 mg/dL    Comment: CRITICAL RESULT CALLED TO, READ BACK BY AND VERIFIED WITH: S Lukisha Procida,RN AT 1213 2018-10-28 BY L BENFIELD    BUN 59 (H) 6 - 20 mg/dL   Creatinine, Ser 4.74 (H) 0.61 - 1.24 mg/dL   Calcium 6.9 (L) 8.9 - 10.3 mg/dL   Total Protein 3.9 (L) 6.5 - 8.1 g/dL   Albumin 1.5 (L) 3.5 - 5.0 g/dL   AST 360 (H) 15 - 41 U/L   ALT 109 (H) 0 - 44 U/L     Comment: RESULTS CONFIRMED BY MANUAL DILUTION   Alkaline Phosphatase 66 38 - 126 U/L   Total Bilirubin 1.8 (H) 0.3 - 1.2 mg/dL   GFR calc non Af Amer 13 (L) >60 mL/min   GFR calc Af Amer 15 (L) >60 mL/min   Anion gap 22 (H) 5 - 15    Comment: Performed at [facility], [address], [place], [place] [address]  Lactic acid, plasma     Status: Abnormal   Collection Time: 2018-10-28 11:11 AM  Result Value Ref Range   Lactic Acid, Venous >11.0 (HH) 0.5 - 1.9 mmol/L    Comment: CRITICAL RESULT CALLED TO, READ BACK BY AND VERIFIED WITH: S Cristan Scherzer,RN AT 1155 10-28-2018 BY L BENFIELD Performed at [facility], [place] [address], [place], [place] [address]   CBC with Differential     Status: Abnormal   Collection Time: 10-28-18 11:11 AM  Result Value Ref Range   WBC 13.5 (H) 4.0 - 10.5 K/uL   RBC 3.72 (L) 4.22 - 5.81 MIL/uL   Hemoglobin 13.2 13.0 - 17.0 g/dL   HCT 41.1 39.0 - 52.0 %   MCV 110.5 (H) 80.0 - 100.0 fL   MCH 35.5 (H) 26.0 - 34.0 pg   MCHC 32.1 30.0 - 36.0 g/dL   RDW 15.7 (H) 11.5 - 15.5 %   Platelets 91 (L) 150 - 400 K/uL    Comment: REPEATED TO VERIFY PLATELET COUNT CONFIRMED BY SMEAR SPECIMEN CHECKED FOR CLOTS Immature Platelet Fraction may be clinically indicated, consider ordering this additional test [id number]    nRBC 2.4 (H) 0.0 - 0.2 %   Neutrophils Relative % 81 %   Neutro Abs 10.9 (H) 1.7 - 7.7 K/uL   Lymphocytes Relative 7 %   Lymphs Abs 1.0 0.7 - 4.0 K/uL   Monocytes Relative 6 %   Monocytes Absolute 0.8 0.1 - 1.0 K/uL   Eosinophils Relative 0 %   Eosinophils Absolute 0.0 0.0 - 0.5 K/uL   Basophils Relative 1 %   Basophils Absolute 0.1 0.0 - 0.1 K/uL   WBC Morphology VACUOLATED NEUTROPHILS    Immature Granulocytes 5 %   Abs Immature Granulocytes 0.71 (H) 0.00 - 0.07 K/uL    Comment: Performed at [facility], [place] [address], [place], [place] [address]  Protime-INR     Status: Abnormal   Collection Time:  10-30-18 11:11 AM  Result Value Ref  Range   Prothrombin Time 21.7 (H) 11.4 - 15.2 seconds   INR 1.9 (H) 0.8 - 1.2    Comment: (NOTE) INR goal varies based on device and disease states. Performed at [facility], [place] [address] [place], [place] [address]   Troponin I (High Sensitivity)     Status: Abnormal   Collection Time: 2018-10-30 11:11 AM  Result Value Ref Range   Troponin I (High Sensitivity) 63 (H) <18 ng/L    Comment: (NOTE) Elevated high sensitivity troponin I (hsTnI) values and significant  changes across serial measurements may suggest ACS but many other  chronic and acute conditions are known to elevate hsTnI results.  Refer to the Links section for chest pain algorithms and additional  guidance. Performed at [facility], [place] [address] [place], [place] [address]   SARS Coronavirus 2 (CEPHEID - Performed in [place] hospital lab), Hosp Order     Status: None   Collection Time: 10/30/2018 11:11 AM   Specimen: Nasopharyngeal Swab  Result Value Ref Range   SARS Coronavirus 2 NEGATIVE NEGATIVE    Comment: (NOTE) If result is NEGATIVE SARS-CoV-2 target nucleic acids are NOT DETECTED. The SARS-CoV-2 RNA is generally detectable in upper and lower  respiratory specimens during the acute phase of infection. The lowest  concentration of SARS-CoV-2 viral copies this assay can detect is 250  copies / mL. A negative result does not preclude SARS-CoV-2 infection  and should not be used as the sole basis for treatment or other  patient management decisions.  A negative result may occur with  improper specimen collection / handling, submission of specimen other  than nasopharyngeal swab, presence of viral mutation(s) within the  areas targeted by this assay, and inadequate number of viral copies  (<250 copies / mL). A negative result must be combined with clinical  observations, patient history, and epidemiological information. If result is POSITIVE SARS-CoV-2 target nucleic acids are DETECTED. The  SARS-CoV-2 RNA is generally detectable in upper and lower  respiratory specimens dur ing the acute phase of infection.  Positive  results are indicative of active infection with SARS-CoV-2.  Clinical  correlation with patient history and other diagnostic information is  necessary to determine patient infection status.  Positive results do  not rule out bacterial infection or co-infection with other viruses. If result is PRESUMPTIVE POSTIVE SARS-CoV-2 nucleic acids MAY BE PRESENT.   A presumptive positive result was obtained on the submitted specimen  and confirmed on repeat testing.  While 2019 novel coronavirus  (SARS-CoV-2) nucleic acids may be present in the submitted sample  additional confirmatory testing may be necessary for epidemiological  and / or clinical management purposes  to differentiate between  SARS-CoV-2 and other Sarbecovirus currently known to infect humans.  If clinically indicated additional testing with an alternate test  methodology [phone]) is advised. The SARS-CoV-2 RNA is generally  detectable in upper and lower respiratory sp ecimens during the acute  phase of infection. The expected result is Negative. Fact Sheet for Patients:  StrictlyIdeas.no Fact Sheet for Healthcare Providers: BankingDealers.co.za This test is not yet approved or cleared by the [place] FDA and has been authorized for detection and/or diagnosis of SARS-CoV-2 by FDA under an Emergency Use Authorization (EUA).  This EUA will remain in effect (meaning this test can be used) for the duration of the COVID-19 declaration under Section 564(b)(1) of the Act, 21 U.S.C. section 360bbb-3(b)(1), unless  the authorization is terminated or revoked sooner. Performed at [facility], [place] [address], [place], [place] [address]   CK     Status: Abnormal   Collection Time: 12-Oct-2018 11:11 AM  Result Value Ref Range   Total CK 709 (H) 49 - 397  U/L    Comment: Performed at [facility], [place] [address] [place], [place] [address]  CBG monitoring, ED     Status: Abnormal   Collection Time: 10/12/18 11:19 AM  Result Value Ref Range   Glucose-Capillary 448 (H) 70 - 99 mg/dL  POCT I-Stat EG7     Status: Abnormal   Collection Time: 2018-10-12 11:35 AM  Result Value Ref Range   pH, Ven 7.098 (LL) 7.250 - 7.430   pCO2, Ven 52.6 44.0 - 60.0 mmHg   pO2, Ven 43.0 32.0 - 45.0 mmHg   Bicarbonate 16.3 (L) 20.0 - 28.0 mmol/L   TCO2 18 (L) 22 - 32 mmol/L   O2 Saturation 61.0 %   Acid-base deficit 14.0 (H) 0.0 - 2.0 mmol/L   Sodium 126 (L) 135 - 145 mmol/L   Potassium 6.8 (HH) 3.5 - 5.1 mmol/L   Calcium, Ion 1.01 (L) 1.15 - 1.40 mmol/L   HCT 45.0 39.0 - 52.0 %   Hemoglobin 15.3 13.0 - 17.0 g/dL   Patient temperature HIDE    Collection site [employer] by Nurse    Sample type VENOUS    Comment NOTIFIED PHYSICIAN   I-stat chem 8, ED     Status: Abnormal   Collection Time: 12-Oct-2018 11:42 AM  Result Value Ref Range   Sodium 125 (L) 135 - 145 mmol/L   Potassium 6.3 (HH) 3.5 - 5.1 mmol/L   Chloride 95 (L) 98 - 111 mmol/L   BUN 61 (H) 6 - 20 mg/dL   Creatinine, Ser 4.20 (H) 0.61 - 1.24 mg/dL   Glucose, Bld 478 (H) 70 - 99 mg/dL   Calcium, Ion 0.90 (L) 1.15 - 1.40 mmol/L   TCO2 17 (L) 22 - 32 mmol/L   Hemoglobin 13.9 13.0 - 17.0 g/dL   HCT 41.0 39.0 - 52.0 %   Comment NOTIFIED PHYSICIAN    Dg Chest Port 1 View  Result Date: October 12, 2018 CLINICAL DATA:  Follow-up pneumothorax EXAM: PORTABLE CHEST 1 VIEW COMPARISON:  Earlier same day FINDINGS: Endotracheal tube tip is 4 cm above the carina. Nasogastric tube enters the stomach. Right internal jugular central line tip is in the SVC above the right atrium. Left chest tube is repositioned in there is resolution of the left pneumothorax. There is mild atelectasis in the left lower lobe. IMPRESSION: Reposition left chest tube. Resolution of left pneumothorax. Mild atelectasis left  lower lobe. Electronically Signed   By: Nelson Chimes M.D.   On: Oct 12, 2018 12:56   Dg Chest Portable 1 View  Result Date: 2018-10-12 CLINICAL DATA:  Follow-up left pneumothorax status post chest tube placement EXAM: PORTABLE CHEST 1 VIEW COMPARISON:  Chest radiograph from earlier today. FINDINGS: Basilar left chest tube terminates over left hemidiaphragm. Endotracheal tube tip is 3.8 cm above the carina. Enteric tube enters stomach with the tip not seen on this image. Right internal jugular central venous catheter terminates over the cavoatrial junction. Stable cardiomediastinal silhouette with normal heart size. Large left tension pneumothorax is unchanged with stable right mediastinal shift. No right pneumothorax. No pleural effusion. Complete left lung atelectasis. Clear right lung. IMPRESSION: 1. Persistent large left tension pneumothorax with right mediastinal  shift, unchanged status post left basilar chest tube placement. 2. Stable complete left lung atelectasis. 3. Well-positioned endotracheal and enteric tubes. Critical Value/emergent results were called by telephone at the time of interpretation on 10/08/18 at 12:34 pm to Dr. Marda Stalker , who verbally acknowledged these results. Electronically Signed   By: Ilona Sorrel M.D.   On: October 08, 2018 12:35   Dg Chest Portable 1 View  Result Date: 08-Oct-2018 CLINICAL DATA:  Status post CPR. EXAM: PORTABLE CHEST 1 VIEW COMPARISON:  Radiograph of July 07, 2016. FINDINGS: There is interval development of large left pneumothorax with complete atelectasis of the left lung. Endotracheal tube is in good position. Nasogastric tube is seen entering the stomach. Right internal jugular catheter is noted with tip in expected position of SVC. Right lung is clear. Bony thorax is unremarkable. IMPRESSION: Endotracheal and nasogastric tubes are in grossly good position. Large left pneumothorax is noted with complete atelectasis of the left lung. Critical  Value/emergent results were called by telephone at the time of interpretation on 08-Oct-2018 at 11:46 am to Dr. Marda Stalker , who verbally acknowledged these results. Electronically Signed   By: Marijo Conception M.D.   On: 2018-10-08 11:48    Pending Labs Unresulted Labs (From admission, onward)    Start     Ordered   10/01/18 0500  Comprehensive metabolic panel  Tomorrow morning,   R     08-Oct-2018 1227   10/01/18 6222  Basic metabolic panel  Tomorrow morning,   R     Oct 08, 2018 1253   10/01/18 0500  Blood gas, arterial  Tomorrow morning,   R     10/08/2018 1253   10/01/18 0500  Magnesium  Tomorrow morning,   R     Oct 08, 2018 1253   10/01/18 0500  Phosphorus  Tomorrow morning,   R     10/08/2018 1253   10-08-18 1304  Lipase, blood  ONCE - STAT,   STAT     Oct 08, 2018 1303   10/08/2018 1304  Amylase  Once,   STAT     Oct 08, 2018 1303   10-08-18 1255  Protime-INR  Once,   STAT     2018-10-08 1254   10/08/2018 1252  CBC  Now then every 6 hours,   R (with STAT occurrences)     2018/10/08 1253   2018/10/08 1252  Type and screen If need to transfuse blood products please use the blood administration order set  Once,   STAT    Comments: If need to transfuse blood products please use the blood administration order set    10-08-2018 1253   10-08-18 1251  HIV antibody (Routine Testing)  Once,   STAT     Oct 08, 2018 1253   10-08-18 1111  Pathologist smear review  Once,   STAT     Oct 08, 2018 1111   10/08/2018 1021  TSH  ONCE - STAT,   STAT     Oct 08, 2018 1020   10-08-18 1020  Urine culture  ONCE - STAT,   STAT     10/08/2018 1019   Oct 08, 2018 1019  Rapid urine drug screen (hospital performed)  ONCE - STAT,   STAT     2018/10/08 1019   2018/10/08 1015  Troponin I (High Sensitivity)  STAT Now then every 2 hours,   R (with STAT occurrences)    Question Answer Comment  Indication Other   Specify indication CPR      10/08/18 1015   10-08-18 1014  Lactic acid, plasma  Now then every 2 hours,   STAT     24-Oct-2018 1015   October 24, 2018  1014  Culture, blood (Routine x 2)  BLOOD CULTURE X 2,   STAT     24-Oct-2018 1015   2018/10/24 1014  Urinalysis, Routine w reflex microscopic  ONCE - STAT,   STAT     24-Oct-2018 1015          Vitals/Pain Today's Vitals   2018-10-24 1315 October 24, 2018 1320 10/24/2018 1325 October 24, 2018 1330  BP:  (!) 77/52  104/74  Pulse:   (!) 128   Resp: (!) 36 (!) 24 (!) 30 (!) 30  Temp: (!) 96.5 F (35.8 C) (!) 96.4 F (35.8 C) (!) 96.3 F (35.7 C) (!) 96.1 F (35.6 C)  SpO2:   100%   Weight:      Height:        Isolation Precautions No active isolations  Medications Medications  propofol (DIPRIVAN) 1000 MG/100ML infusion (0 mcg/kg/min  65.8 kg Intravenous Stopped 10-24-2018 1238)  ipratropium-albuterol (DUONEB) 0.5-2.5 (3) MG/3ML nebulizer solution 3 mL (3 mLs Nebulization Not Given 2018-10-24 1230)  chlorhexidine gluconate (MEDLINE KIT) (PERIDEX) 0.12 % solution 15 mL (has no administration in time range)  MEDLINE mouth rinse (has no administration in time range)  fentaNYL (SUBLIMAZE) injection 50 mcg (has no administration in time range)  fentaNYL (SUBLIMAZE) injection 50-200 mcg (has no administration in time range)  midazolam (VERSED) injection 2 mg (has no administration in time range)  midazolam (VERSED) injection 1 mg (has no administration in time range)  sodium bicarbonate 150 mEq in sterile water 1,000 mL infusion ( Intravenous New Bag/Given 24-Oct-2018 1327)  0.9 %  sodium chloride infusion (has no administration in time range)  acetaminophen (TYLENOL) tablet 650 mg (has no administration in time range)  ondansetron (ZOFRAN) injection 4 mg (has no administration in time range)  famotidine (PEPCID) IVPB 20 mg premix (20 mg Intravenous New Bag/Given October 24, 2018 1330)  0.9 %  sodium chloride infusion (has no administration in time range)  norepinephrine (LEVOPHED) '4mg'$  in 262m premix infusion (has no administration in time range)  vasopressin (PITRESSIN) 40 Units in sodium chloride 0.9 % 250 mL (0.16 Units/mL)  infusion (has no administration in time range)  hydrocortisone sodium succinate (SOLU-CORTEF) 100 MG injection 50 mg (has no administration in time range)  rocuronium (ZEMURON) injection (50 mg Intravenous Given 707/29/20201315)  sodium bicarbonate injection (100 mEq Intravenous Given 707-29-20201320)  calcium chloride injection (1 g Intravenous Given 707/29/201322)  dextrose 50 % solution (  Given 707-29-20201006)  sodium chloride flush (NS) 0.9 % injection 3 mL (3 mLs Intravenous Given 72020/07/291038)  dextrose 50 % solution 25 g (25 g Intravenous Given 707-29-201016)  albuterol (PROVENTIL) (2.5 MG/3ML) 0.083% nebulizer solution 10 mg (10 mg Nebulization Given 707/29/20201210)  calcium gluconate 1 g/ 50 mL sodium chloride IVPB (0 g Intravenous Stopped 72020/07/291335)    Mobility  High fall risk   Focused Assessments Cardiac Assessment Handoff:  Cardiac Rhythm: Normal sinus rhythm Lab Results  Component Value Date   CKTOTAL 709 (H) 0July 29, 2020  No results found for: DDIMER Does the Patient currently have chest pain?     R Recommendations: See Admitting Provider Note  Report given to:   Additional Notes:

## 2018-10-27 NOTE — Progress Notes (Signed)
Contacted mother and informed her that son HR is 94's and b/p is 20's/10's and that it may be soon, she stated ok and family is on the way to hospital.

## 2018-10-27 NOTE — Progress Notes (Signed)
Pt sat probe is not picking up, complete a ABG and O2 sats were 100%, CCM on the floor and MD came by and up rate of patient bicarb to 150.

## 2018-10-27 NOTE — Procedures (Signed)
Extubation Procedure Note  Patient Details:   Name: Senan Urey DOB: [birth year] MRN: [id number]   Airway Documentation:    Vent end date: 2018-10-15 Vent end time: 2345  Pt passed.   Clance Boll October 15, 2018, 11:49 PM

## 2018-10-27 NOTE — Procedures (Signed)
Arterial Catheter Insertion Procedure Note Triton Heidrich [id number] 09-04-1960  Procedure: Insertion of Arterial Catheter  Indications: Blood pressure monitoring  Procedure Details Consent: Unable to obtain consent because of emergent medical necessity. Time Out: Verified patient identification, verified procedure, site/side was marked, verified correct patient position, special equipment/implants available, medications/allergies/relevent history reviewed, required imaging and test results available.  Performed  Maximum sterile technique was used including antiseptics, cap, gloves, gown, hand hygiene, mask and sheet. Skin prep: Chlorhexidine; local anesthetic administered 20 gauge catheter was inserted into left femoral artery using the Seldinger technique. ULTRASOUND GUIDANCE USED: YES Evaluation Blood flow good; BP tracing good. Complications: No apparent complications.   Candee Furbish 2018-10-30

## 2018-10-27 NOTE — Procedures (Signed)
Hemodialysis Catheter Insertion Procedure Note Daniel Abbott [id number] [birth year]  Procedure: Insertion of Hemodialysis Catheter Indications: Dialysis Access   Procedure Details Consent: Risks of procedure as well as the alternatives and risks of each were explained to the (patient/caregiver).  Consent for procedure obtained. Time Out: Verified patient identification, verified procedure, site/side was marked, verified correct patient position, special equipment/implants available, medications/allergies/relevent history reviewed, required imaging and test results available.  Performed  Maximum sterile technique was used including antiseptics, cap, gloves, gown, hand hygiene, mask and sheet. Skin prep: Chlorhexidine; local anesthetic administered Triple lumen hemodialysis catheter was inserted into right femoral vein due to emergent situation using the Seldinger technique.  Evaluation Blood flow good Complications: Complications of line would not advance past 15 cm, good flow and pull noted in HD ports so sutured it in place at this level Patient did tolerate procedure well. Chest X-ray ordered to verify placement.  CXR: not needed.   Daniel Abbott 10-07-18

## 2018-10-27 NOTE — Progress Notes (Signed)
Patient passed at 2328, tried to contact family back and no answer to update on patient status.

## 2018-10-27 DEATH — deceased

## 2020-02-04 IMAGING — DX PORTABLE CHEST - 1 VIEW
1 series · 1 of 1 positions shown · non-contrast
Comparison: Earlier same day

CLINICAL DATA: Follow-up pneumothorax

EXAM:
PORTABLE CHEST 1 VIEW

[chest ap]
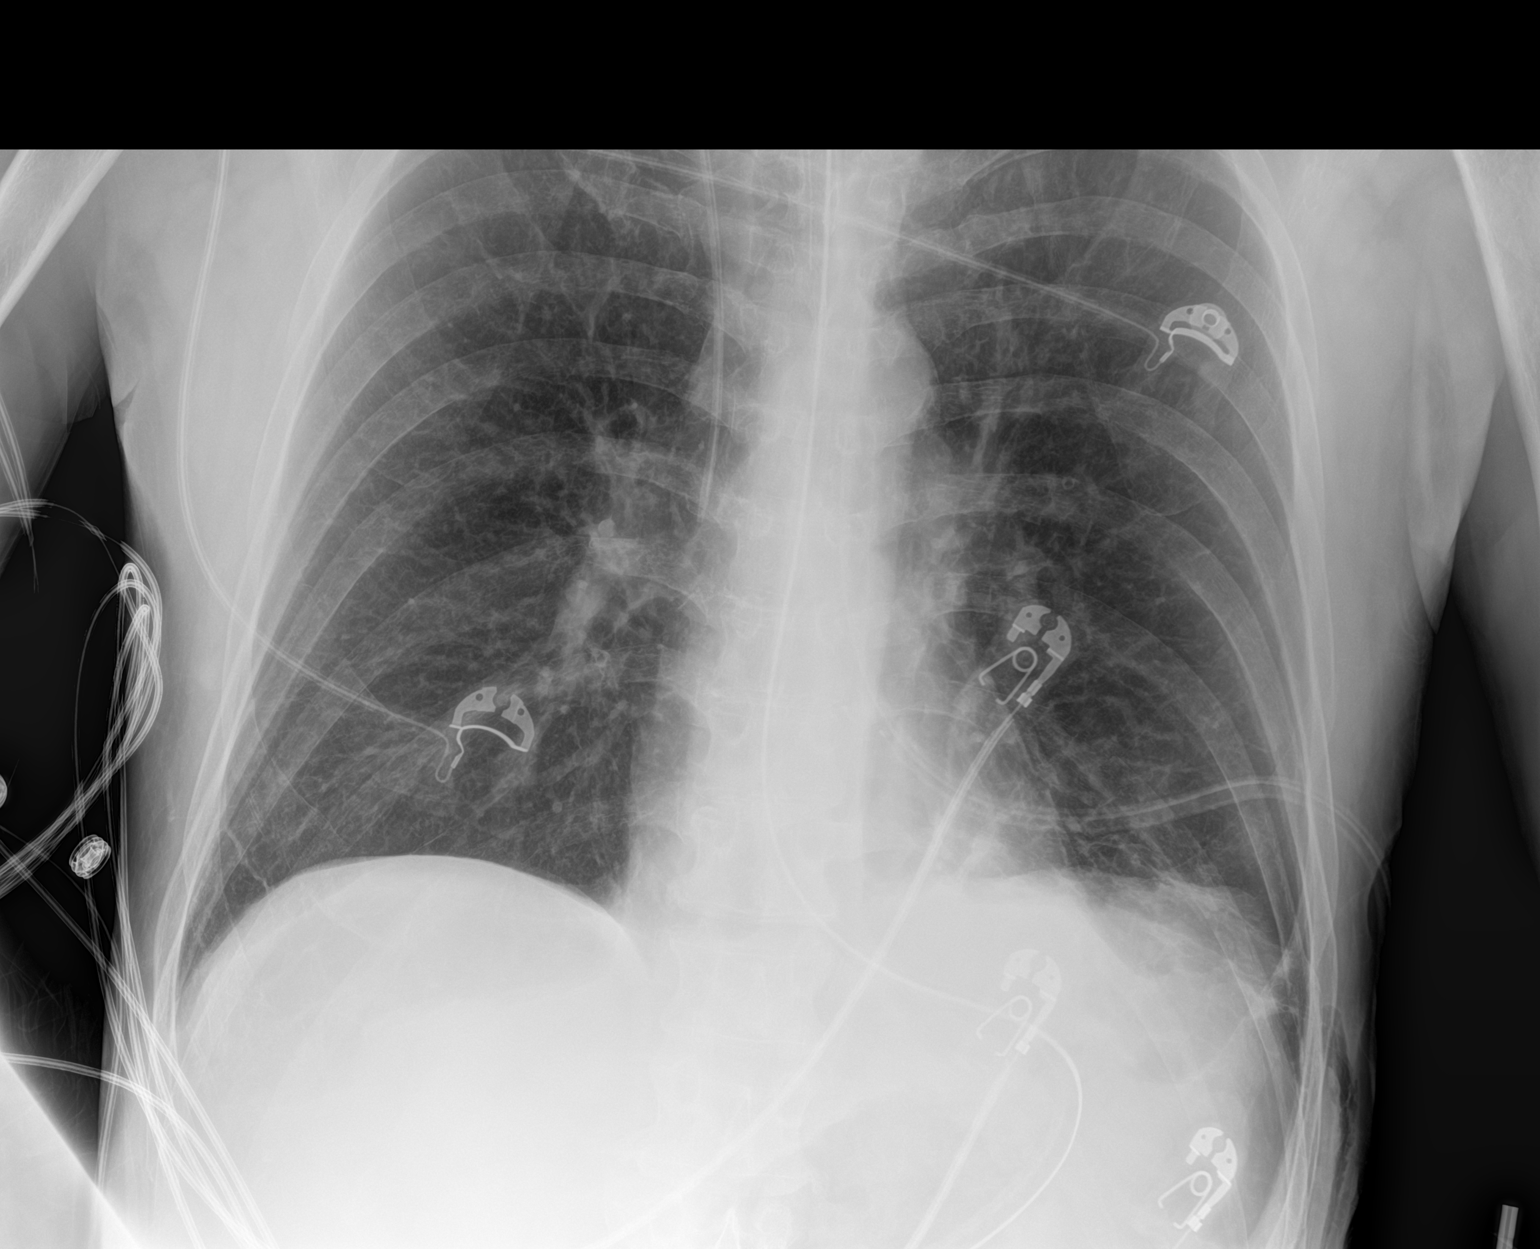

[1 of 1 positions shown; findings below may reference images not displayed]

FINDINGS: Endotracheal tube tip is 4 cm above the carina. Nasogastric tube
enters the stomach. Right internal jugular central line tip is in
the SVC above the right atrium. Left chest tube is repositioned in
there is resolution of the left pneumothorax. There is mild
atelectasis in the left lower lobe.
IMPRESSION: Reposition left chest tube. Resolution of left pneumothorax. Mild
atelectasis left lower lobe.
